# Patient Record
Sex: Female | Born: 1940 | Race: White | Hispanic: No | Marital: Married | Smoking: Never smoker
Health system: Southern US, Community
[De-identification: ages and names within clinical notes are randomized; demographics above are authoritative.]

## PROBLEM LIST (undated history)

## (undated) DIAGNOSIS — R7303 Prediabetes: Secondary | ICD-10-CM

## (undated) DIAGNOSIS — I10 Essential (primary) hypertension: Secondary | ICD-10-CM

## (undated) DIAGNOSIS — K219 Gastro-esophageal reflux disease without esophagitis: Secondary | ICD-10-CM

## (undated) DIAGNOSIS — M1990 Unspecified osteoarthritis, unspecified site: Secondary | ICD-10-CM

## (undated) HISTORY — PX: EYE SURGERY: SHX253

---

## 2008-09-20 ENCOUNTER — Ambulatory Visit (HOSPITAL_COMMUNITY): Admission: RE | Admit: 2008-09-20 | Discharge: 2008-09-20 | Payer: Self-pay | Admitting: Family Medicine

## 2008-09-25 ENCOUNTER — Ambulatory Visit (HOSPITAL_COMMUNITY): Admission: RE | Admit: 2008-09-25 | Discharge: 2008-09-25 | Payer: Self-pay | Admitting: Gastroenterology

## 2008-09-25 ENCOUNTER — Ambulatory Visit: Payer: Self-pay | Admitting: Gastroenterology

## 2008-09-25 ENCOUNTER — Encounter: Payer: Self-pay | Admitting: Gastroenterology

## 2008-09-27 ENCOUNTER — Encounter: Payer: Self-pay | Admitting: Gastroenterology

## 2010-01-24 ENCOUNTER — Ambulatory Visit (HOSPITAL_COMMUNITY): Admission: RE | Admit: 2010-01-24 | Discharge: 2010-01-24 | Payer: Self-pay | Admitting: Family Medicine

## 2010-12-10 NOTE — Op Note (Signed)
Sara Hendrix, Sara Hendrix               ACCOUNT NO.:  [id number]   MEDICAL RECORD NO.:  [id number]          PATIENT TYPE:  AMB   LOCATION:  DAY                           FACILITY:  [facility]   PHYSICIAN:  Kassie Mends, M.D.      DATE OF BIRTH:  1941-06-18   DATE OF PROCEDURE:  DATE OF DISCHARGE:                               OPERATIVE REPORT   REFERRING PHYSICIAN:  Angus G. McInnis, MD   PROCEDURE:  Colonoscopy with cold forceps polypectomy.   INDICATION FOR EXAMINATION:  Sara Hendrix is a 70 year old female who  presents for average-risk colon cancer screening.   FINDINGS:  1. A single hepatic flexure diverticulum.  Rare sigmoid colon      diverticula.  Otherwise, no masses, inflammatory changes, or AVMs      seen.  2. A 1-mm rectal polyp removed via cold forceps.  Small internal      hemorrhoids.  Otherwise, normal retroflex view of the rectum.   RECOMMENDATIONS:  1. We will call Ms. Line with the results of her biopsies.  If she has      a simple adenoma, screening colonoscopy in 10 years.  2. She should follow a high-fiber diet.  She is given a handout on      high-fiber diet, polyps, diverticulosis, and hemorrhoids.  3. No aspirin, NSAIDs, or anticoagulation for 5 days.   MEDICATIONS:  1. Demerol 75 mg IV.  2. Versed 4 mg IV.   PROCEDURE TECHNIQUE:  Physical exam was performed.  Informed consent was  obtained from the patient after explaining the benefits, risks, and  alternatives to the procedure.  The patient was connected to the monitor  and placed in the left lateral position.  Continuous oxygen was provided  by nasal cannula.  IV medicine administered through an indwelling  cannula.  After administration of sedation and rectal exam, the  patient's rectum was intubated and the scope was advanced under direct  visualization to the cecum.  The scope was removed slowly by carefully  examining the color, texture, anatomy, and integrity of the mucosa on  the way out.  The patient was  recovered in endoscopy and discharged home  in satisfactory condition.   PATH:  Polypoid mucosa. TCS in 10 years.      Kassie Mends, M.D.  Electronically Signed     SM/MEDQ  D:  09/25/2008  T:  09/25/2008  Job:  [id number]   cc:   Angus G. Renard Matter, MD  Fax: [phone]

## 2012-07-26 ENCOUNTER — Other Ambulatory Visit (HOSPITAL_COMMUNITY): Payer: Self-pay | Admitting: Nurse Practitioner

## 2012-07-26 DIAGNOSIS — Z139 Encounter for screening, unspecified: Secondary | ICD-10-CM

## 2012-08-09 ENCOUNTER — Ambulatory Visit (HOSPITAL_COMMUNITY)
Admission: RE | Admit: 2012-08-09 | Discharge: 2012-08-09 | Disposition: A | Discharge status: Home / Self Care | Payer: Medicare Other | Source: Ambulatory Visit | Attending: Nurse Practitioner | Admitting: Nurse Practitioner

## 2012-08-09 DIAGNOSIS — Z139 Encounter for screening, unspecified: Secondary | ICD-10-CM

## 2012-08-09 DIAGNOSIS — Z1231 Encounter for screening mammogram for malignant neoplasm of breast: Secondary | ICD-10-CM | POA: Insufficient documentation

## 2014-03-21 ENCOUNTER — Other Ambulatory Visit (HOSPITAL_COMMUNITY): Payer: Self-pay | Admitting: Family Medicine

## 2014-03-21 DIAGNOSIS — Z139 Encounter for screening, unspecified: Secondary | ICD-10-CM

## 2014-03-27 ENCOUNTER — Ambulatory Visit (HOSPITAL_COMMUNITY)
Admission: RE | Admit: 2014-03-27 | Discharge: 2014-03-27 | Disposition: A | Discharge status: Home / Self Care | Payer: Medicare Other | Source: Ambulatory Visit | Attending: Family Medicine | Admitting: Family Medicine

## 2014-03-27 DIAGNOSIS — Z139 Encounter for screening, unspecified: Secondary | ICD-10-CM

## 2014-03-27 DIAGNOSIS — Z1231 Encounter for screening mammogram for malignant neoplasm of breast: Secondary | ICD-10-CM | POA: Insufficient documentation

## 2014-04-29 ENCOUNTER — Emergency Department: Payer: Self-pay | Admitting: Emergency Medicine

## 2015-04-27 ENCOUNTER — Other Ambulatory Visit (HOSPITAL_COMMUNITY): Payer: Self-pay | Admitting: Nurse Practitioner

## 2015-04-27 DIAGNOSIS — Z1231 Encounter for screening mammogram for malignant neoplasm of breast: Secondary | ICD-10-CM

## 2015-05-11 ENCOUNTER — Ambulatory Visit (HOSPITAL_COMMUNITY)
Admission: RE | Admit: 2015-05-11 | Discharge: 2015-05-11 | Disposition: A | Discharge status: Home / Self Care | Payer: Medicare HMO | Source: Ambulatory Visit | Attending: Nurse Practitioner | Admitting: Nurse Practitioner

## 2015-05-11 DIAGNOSIS — Z1231 Encounter for screening mammogram for malignant neoplasm of breast: Secondary | ICD-10-CM

## 2018-10-05 ENCOUNTER — Encounter: Payer: Self-pay | Admitting: Gastroenterology

## 2019-07-23 ENCOUNTER — Emergency Department
Admission: EM | Admit: 2019-07-23 | Discharge: 2019-07-23 | Disposition: A | Discharge status: Home / Self Care | Payer: Medicare HMO | Attending: Emergency Medicine | Admitting: Emergency Medicine

## 2019-07-23 ENCOUNTER — Emergency Department: Payer: Medicare HMO

## 2019-07-23 ENCOUNTER — Encounter: Payer: Self-pay | Admitting: Emergency Medicine

## 2019-07-23 ENCOUNTER — Other Ambulatory Visit: Payer: Self-pay

## 2019-07-23 DIAGNOSIS — S82841A Displaced bimalleolar fracture of right lower leg, initial encounter for closed fracture: Secondary | ICD-10-CM | POA: Insufficient documentation

## 2019-07-23 DIAGNOSIS — Z79899 Other long term (current) drug therapy: Secondary | ICD-10-CM | POA: Diagnosis not present

## 2019-07-23 DIAGNOSIS — Y999 Unspecified external cause status: Secondary | ICD-10-CM | POA: Insufficient documentation

## 2019-07-23 DIAGNOSIS — S99911A Unspecified injury of right ankle, initial encounter: Secondary | ICD-10-CM | POA: Diagnosis present

## 2019-07-23 DIAGNOSIS — W1843XA Slipping, tripping and stumbling without falling due to stepping from one level to another, initial encounter: Secondary | ICD-10-CM | POA: Diagnosis not present

## 2019-07-23 DIAGNOSIS — Y939 Activity, unspecified: Secondary | ICD-10-CM | POA: Diagnosis not present

## 2019-07-23 DIAGNOSIS — Y92018 Other place in single-family (private) house as the place of occurrence of the external cause: Secondary | ICD-10-CM | POA: Diagnosis not present

## 2019-07-23 MED ORDER — ONDANSETRON HCL 4 MG/2ML IJ SOLN
4.0000 mg | Freq: Once | INTRAMUSCULAR | Status: AC
  Administered 2019-07-23: 21:00:00 4 mg via INTRAVENOUS
  Filled 2019-07-23: qty 2

## 2019-07-23 MED ORDER — ONDANSETRON 4 MG PO TBDP: 4.0000 mg | ORAL_TABLET | Freq: Once | ORAL | Status: DC

## 2019-07-23 MED ORDER — FENTANYL CITRATE (PF) 100 MCG/2ML IJ SOLN
25.0000 ug | Freq: Once | INTRAMUSCULAR | Status: AC
  Administered 2019-07-23: 25 ug via INTRAVENOUS
  Filled 2019-07-23: qty 2

## 2019-07-23 MED ORDER — ONDANSETRON 4 MG PO TBDP: 4.0000 mg | ORAL_TABLET | Freq: Three times a day (TID) | ORAL | 0 refills | Status: DC | PRN

## 2019-07-23 MED ORDER — HYDROCODONE-ACETAMINOPHEN 5-325 MG PO TABS: 1.0000 | ORAL_TABLET | Freq: Three times a day (TID) | ORAL | 0 refills | Status: AC | PRN

## 2019-07-23 MED ORDER — OXYCODONE-ACETAMINOPHEN 5-325 MG PO TABS
1.0000 | ORAL_TABLET | Freq: Once | ORAL | Status: AC
  Administered 2019-07-23: 1 via ORAL
  Filled 2019-07-23: qty 1

## 2019-07-23 NOTE — Discharge Instructions (Addendum)
Your exam and XR revealed a dislocated fracture of ankle (bimalleolar). We attempted to reduce the dislocation, but could not achieve full anatomic positioning, comfortably. You will call Dr. Marry Guan on Monday to arrange for a follow-up exam. Use the walker to get around without putting any weight on your splint/ankle. Rest with the foot elevated.

## 2019-07-23 NOTE — ED Provider Notes (Signed)
[facility] Emergency Department Provider Note ____________________________________________  Time seen: 2025  I have reviewed the triage vital signs and the nursing notes.  HISTORY  Chief Complaint  Ankle Pain  HPI Sara Hendrix is a 78 y.o. female presents to the ED for evaluation of sudden right ankle pain and disability after mechanical fall.  Patient describes she apparently had just missed the last step going down to her basement, when her foot slipped off lip of the step.  Causing her to roll her right ankle.  She denies any other injury at this time.  She presents now for ankle pain and deformity  History reviewed. No pertinent past medical history.  There are no problems to display for this patient.  History reviewed. No pertinent surgical history.  Prior to Admission medications   Medication Sig Start Date End Date Taking? Authorizing Provider  atorvastatin (LIPITOR) 20 MG tablet Take 20 mg by mouth daily.   Yes [provider]  lisinopril (ZESTRIL) 10 MG tablet Take 10 mg by mouth daily.   Yes [provider]  meloxicam (MOBIC) 7.5 MG tablet Take 7.5 mg by mouth daily.   Yes [provider]  omeprazole (PRILOSEC) 20 MG capsule Take 20 mg by mouth daily.   Yes [provider]  HYDROcodone-acetaminophen (NORCO) 5-325 MG tablet Take 1 tablet by mouth 3 (three) times daily as needed for up to 5 days. 07/23/19 07/28/19  Egbert Seidel, Dannielle Karvonen, PA-C  ondansetron (ZOFRAN ODT) 4 MG disintegrating tablet Take 1 tablet (4 mg total) by mouth every 8 (eight) hours as needed. 07/23/19   Tyleek Smick, Dannielle Karvonen, PA-C    Allergies Codeine  History reviewed. No pertinent family history.  Social History Social History   Tobacco Use  . Smoking status: Never Smoker  . Smokeless tobacco: Never Used  Substance Use Topics  . Alcohol use: Not on file  . Drug use: Not on file    Review of Systems  Constitutional: Negative  for fever. Cardiovascular: Negative for chest pain. Respiratory: Negative for shortness of breath. Gastrointestinal: Negative for abdominal pain, vomiting and diarrhea. Genitourinary: Negative for dysuria. Musculoskeletal: Negative for back pain. Right ankle pain and deformity. Skin: Negative for rash. Neurological: Negative for headaches, focal weakness or numbness. ____________________________________________  PHYSICAL EXAM:  VITAL SIGNS: ED Triage Vitals [07/23/19 1929]  Enc Vitals Group     BP (!) 169/74     Pulse Rate 93     Resp 18     Temp 98.4 F (36.9 C)     Temp Source Oral     SpO2 96 %     Weight 172 lb (78 kg)     Height 5\' 6"  (1.676 m)     Head Circumference      Peak Flow      Pain Score 8     Pain Loc      Pain Edu?      Excl. in [place]?     Constitutional: Alert and oriented. Well appearing and in no distress. Head: Normocephalic and atraumatic. Eyes: Conjunctivae are normal. PERRL. Normal extraocular movements Cardiovascular: Normal rate, regular rhythm. Normal distal pulses and cap refill. Respiratory: Normal respiratory effort. No wheezes/rales/rhonchi. Gastrointestinal: Soft and nontender. No distention. Musculoskeletal: right ankle with obvious deformity. Nontender with normal range of motion in all extremities.  Neurologic:  Normal gross sensation. Normal speech and language. No gross focal neurologic deficits are appreciated. Skin:  Skin is warm, dry and intact. No  rash noted. Psychiatric: Mood and affect are normal. Patient exhibits appropriate insight and judgment. ____________________________________________   RADIOLOGY  DG Right Ankle  IMPRESSION: Bimalleolar fracture-dislocation RIGHT ankle  DG Right Ankle (Post-Redux) x 2  IMPRESSION: 2130 Persistent displaced bimalleolar fractures and posterior ankle dislocation RIGHT ankle.  IMPRESSION: 2202 1. No significant interval change in the degree of displaced bimalleolar fracture and  lateral dislocation of the ankle mortise. 2. Decrease in the degree of dorsal dislocation of the ankle. ____________________________________________  PROCEDURES  Fentanyl 25 mcg IVP  Zofran 4 gm IVP  Reduction of dislocation  Date/Time: 07/23/2019 8:59 PM Performed by: Melvenia Needles, PA-C Authorized by: Melvenia Needles, PA-C  Consent: Verbal consent obtained. Written consent not obtained. Risks and benefits: risks, benefits and alternatives were discussed Consent given by: patient Patient understanding: patient states understanding of the procedure being performed Patient consent: the patient's understanding of the procedure matches consent given Procedure consent: procedure consent matches procedure scheduled Site marked: the operative site was marked Imaging studies: imaging studies available Patient identity confirmed: verbally with patient Local anesthesia used: no  Anesthesia: Local anesthesia used: no  Sedation: Patient sedated: no  Patient tolerance: patient tolerated the procedure well with no immediate complications Comments: Successful manual reduction of a displaced bimalleolar right ankle fracture.   Marland KitchenSplint Application  Date/Time: 07/23/2019 10:02 PM Performed by: Melvenia Needles, PA-C Authorized by: Melvenia Needles, PA-C   Consent:    Consent obtained:  Verbal   Consent given by:  Patient   Risks discussed:  Pain Pre-procedure details:    Sensation:  Normal Procedure details:    Laterality:  Right   Location:  Ankle   Ankle:  R ankle   Splint type:  Double sugar tong   Supplies:  Elastic bandage, cotton padding and Ortho-Glass Post-procedure details:    Pain:  Improved   Sensation:  Normal   Patient tolerance of procedure:  Tolerated well, no immediate complications   ____________________________________________  INITIAL IMPRESSION / ASSESSMENT AND PLAN / ED  COURSE  ----------------------------------------- 9:44 PM on 07/23/2019 -----------------------------------------  S/W Dr. Donivan Scull: He suggests splinting in place despite less than anatomic alignment. She will follow-up with Ortho on Monday.   Patient with initial fracture management of a bimalleolar fracture of the right ankle with displacement.  Patient is splinted after 2 attempts to reduce the dislocation.  There is some improvement with AP alignment but fracture fragments make it difficult to fully reduce the ankle.  Patient is to follow-up with Ortho as directed.  Prescriptions for Zofran and hydrocodone provided for pain relief.  She will follow-up as directed or return to the ED if necessary.  [place] was evaluated in Emergency Department on 07/23/2019 for the symptoms described in the history of present illness. She was evaluated in the context of the global COVID-19 pandemic, which necessitated consideration that the patient might be at risk for infection with the SARS-CoV-2 virus that causes COVID-19. Institutional protocols and algorithms that pertain to the evaluation of patients at risk for COVID-19 are in a state of rapid change based on information released by regulatory bodies including the CDC and federal and state organizations. These policies and algorithms were followed during the patient's care in the ED.  I reviewed the patient's prescription history over the last 12 months in the multi-state controlled substances database(s) that includes [place], [place], [place], [place], [place], [place], [place], [place], [place], [place], [place], [place], [place], and  [place].  Results were notable for no RX history.  ____________________________________________  FINAL CLINICAL IMPRESSION(S) / ED DIAGNOSES  Final diagnoses:  Bimalleolar ankle fracture, right, closed, initial encounter      Melvenia Needles, PA-C 07/23/19  2259    Vanessa [place], MD 07/23/19 2330

## 2019-07-23 NOTE — ED Triage Notes (Signed)
Patient states that she tripped on the bottom step. Patient with pain and swelling to right ankle.

## 2019-07-25 ENCOUNTER — Encounter: Payer: Self-pay | Admitting: Orthopedic Surgery

## 2019-07-26 DIAGNOSIS — S82841A Displaced bimalleolar fracture of right lower leg, initial encounter for closed fracture: Secondary | ICD-10-CM | POA: Insufficient documentation

## 2019-07-27 ENCOUNTER — Ambulatory Visit: Payer: Medicare HMO | Admitting: Certified Registered Nurse Anesthetist

## 2019-07-27 ENCOUNTER — Other Ambulatory Visit
Admission: RE | Admit: 2019-07-27 | Discharge: 2019-07-27 | Disposition: A | Discharge status: Home / Self Care | Payer: Medicare HMO | Source: Ambulatory Visit | Attending: Orthopedic Surgery | Admitting: Orthopedic Surgery

## 2019-07-27 ENCOUNTER — Other Ambulatory Visit: Payer: Self-pay

## 2019-07-27 ENCOUNTER — Encounter
Admission: RE | Disposition: A | Discharge status: Home / Self Care | Payer: Self-pay | Source: Home / Self Care | Attending: Orthopedic Surgery

## 2019-07-27 ENCOUNTER — Encounter: Payer: Self-pay | Admitting: Orthopedic Surgery

## 2019-07-27 ENCOUNTER — Ambulatory Visit
Admission: RE | Admit: 2019-07-27 | Discharge: 2019-07-27 | Disposition: A | Discharge status: Home / Self Care | Payer: Medicare HMO | Attending: Orthopedic Surgery | Admitting: Orthopedic Surgery

## 2019-07-27 DIAGNOSIS — E785 Hyperlipidemia, unspecified: Secondary | ICD-10-CM | POA: Diagnosis not present

## 2019-07-27 DIAGNOSIS — W108XXA Fall (on) (from) other stairs and steps, initial encounter: Secondary | ICD-10-CM | POA: Insufficient documentation

## 2019-07-27 DIAGNOSIS — S82841A Displaced bimalleolar fracture of right lower leg, initial encounter for closed fracture: Secondary | ICD-10-CM | POA: Diagnosis not present

## 2019-07-27 DIAGNOSIS — Z20828 Contact with and (suspected) exposure to other viral communicable diseases: Secondary | ICD-10-CM | POA: Diagnosis not present

## 2019-07-27 DIAGNOSIS — X501XXA Overexertion from prolonged static or awkward postures, initial encounter: Secondary | ICD-10-CM | POA: Insufficient documentation

## 2019-07-27 DIAGNOSIS — Y9389 Activity, other specified: Secondary | ICD-10-CM | POA: Insufficient documentation

## 2019-07-27 DIAGNOSIS — K219 Gastro-esophageal reflux disease without esophagitis: Secondary | ICD-10-CM | POA: Insufficient documentation

## 2019-07-27 DIAGNOSIS — Y92008 Other place in unspecified non-institutional (private) residence as the place of occurrence of the external cause: Secondary | ICD-10-CM | POA: Diagnosis not present

## 2019-07-27 DIAGNOSIS — Z791 Long term (current) use of non-steroidal anti-inflammatories (NSAID): Secondary | ICD-10-CM | POA: Insufficient documentation

## 2019-07-27 DIAGNOSIS — Z79899 Other long term (current) drug therapy: Secondary | ICD-10-CM | POA: Diagnosis not present

## 2019-07-27 DIAGNOSIS — I10 Essential (primary) hypertension: Secondary | ICD-10-CM | POA: Diagnosis not present

## 2019-07-27 HISTORY — PX: ORIF ANKLE FRACTURE: SHX5408

## 2019-07-27 LAB — RESPIRATORY PANEL BY RT PCR (FLU A&B, COVID)
Influenza A by PCR: NEGATIVE
Influenza B by PCR: NEGATIVE
SARS Coronavirus 2 by RT PCR: NEGATIVE

## 2019-07-27 SURGERY — OPEN REDUCTION INTERNAL FIXATION (ORIF) ANKLE FRACTURE
Anesthesia: General | Site: Ankle | Laterality: Right

## 2019-07-27 MED ORDER — HYDROCODONE-ACETAMINOPHEN 5-325 MG PO TABS: 1.0000 | ORAL_TABLET | ORAL | 0 refills | Status: DC | PRN

## 2019-07-27 MED ORDER — DEXAMETHASONE SODIUM PHOSPHATE 10 MG/ML IJ SOLN
INTRAMUSCULAR | Status: DC | PRN
  Administered 2019-07-27: 10 mg via INTRAVENOUS

## 2019-07-27 MED ORDER — PROPOFOL 10 MG/ML IV BOLUS
INTRAVENOUS | Status: DC | PRN
  Administered 2019-07-27: 100 mg via INTRAVENOUS

## 2019-07-27 MED ORDER — DEXAMETHASONE SODIUM PHOSPHATE 10 MG/ML IJ SOLN
INTRAMUSCULAR | Status: AC
  Filled 2019-07-27: qty 1

## 2019-07-27 MED ORDER — LACTATED RINGERS IV SOLN: INTRAVENOUS | Status: DC

## 2019-07-27 MED ORDER — LIDOCAINE HCL (PF) 2 % IJ SOLN
INTRAMUSCULAR | Status: AC
  Filled 2019-07-27: qty 10

## 2019-07-27 MED ORDER — ONDANSETRON HCL 4 MG/2ML IJ SOLN: 4.0000 mg | Freq: Once | INTRAMUSCULAR | Status: DC | PRN

## 2019-07-27 MED ORDER — FENTANYL CITRATE (PF) 100 MCG/2ML IJ SOLN
INTRAMUSCULAR | Status: AC
  Administered 2019-07-27: 25 ug via INTRAVENOUS
  Filled 2019-07-27: qty 2

## 2019-07-27 MED ORDER — CEFAZOLIN SODIUM-DEXTROSE 2-4 GM/100ML-% IV SOLN
2.0000 g | INTRAVENOUS | Status: AC
  Administered 2019-07-27: 2 g via INTRAVENOUS

## 2019-07-27 MED ORDER — ACETAMINOPHEN 10 MG/ML IV SOLN
INTRAVENOUS | Status: DC | PRN
  Administered 2019-07-27: 1000 mg via INTRAVENOUS

## 2019-07-27 MED ORDER — CHLORHEXIDINE GLUCONATE 4 % EX LIQD: 60.0000 mL | Freq: Once | CUTANEOUS | Status: DC

## 2019-07-27 MED ORDER — BUPIVACAINE HCL 0.25 % IJ SOLN
INTRAMUSCULAR | Status: DC | PRN
  Administered 2019-07-27: 15 mL

## 2019-07-27 MED ORDER — FENTANYL CITRATE (PF) 100 MCG/2ML IJ SOLN
INTRAMUSCULAR | Status: DC | PRN
  Administered 2019-07-27: 50 ug via INTRAVENOUS
  Administered 2019-07-27: 25 ug via INTRAVENOUS
  Administered 2019-07-27 (×2): 50 ug via INTRAVENOUS
  Administered 2019-07-27 (×3): 25 ug via INTRAVENOUS
  Administered 2019-07-27: 50 ug via INTRAVENOUS

## 2019-07-27 MED ORDER — CEFAZOLIN SODIUM-DEXTROSE 2-4 GM/100ML-% IV SOLN
INTRAVENOUS | Status: AC
  Filled 2019-07-27: qty 100

## 2019-07-27 MED ORDER — DEXMEDETOMIDINE HCL 200 MCG/2ML IV SOLN
INTRAVENOUS | Status: DC | PRN
  Administered 2019-07-27 (×2): 8 ug via INTRAVENOUS

## 2019-07-27 MED ORDER — PROPOFOL 10 MG/ML IV BOLUS
INTRAVENOUS | Status: AC
  Filled 2019-07-27: qty 20

## 2019-07-27 MED ORDER — NEOMYCIN-POLYMYXIN B GU 40-200000 IR SOLN
Status: DC | PRN
  Administered 2019-07-27: 4 mL

## 2019-07-27 MED ORDER — SUGAMMADEX SODIUM 200 MG/2ML IV SOLN
INTRAVENOUS | Status: DC | PRN
  Administered 2019-07-27: 200 mg via INTRAVENOUS

## 2019-07-27 MED ORDER — FENTANYL CITRATE (PF) 100 MCG/2ML IJ SOLN
25.0000 ug | INTRAMUSCULAR | Status: DC | PRN
  Administered 2019-07-27: 25 ug via INTRAVENOUS

## 2019-07-27 MED ORDER — PHENYLEPHRINE HCL (PRESSORS) 10 MG/ML IV SOLN
INTRAVENOUS | Status: DC | PRN
  Administered 2019-07-27 (×3): 100 ug via INTRAVENOUS

## 2019-07-27 MED ORDER — ONDANSETRON HCL 4 MG/2ML IJ SOLN
INTRAMUSCULAR | Status: AC
  Filled 2019-07-27: qty 2

## 2019-07-27 MED ORDER — MIDAZOLAM HCL 2 MG/2ML IJ SOLN
INTRAMUSCULAR | Status: AC
  Filled 2019-07-27: qty 2

## 2019-07-27 MED ORDER — FENTANYL CITRATE (PF) 100 MCG/2ML IJ SOLN
INTRAMUSCULAR | Status: AC
  Filled 2019-07-27: qty 2

## 2019-07-27 MED ORDER — ONDANSETRON HCL 4 MG/2ML IJ SOLN
INTRAMUSCULAR | Status: DC | PRN
  Administered 2019-07-27: 4 mg via INTRAVENOUS

## 2019-07-27 MED ORDER — ROCURONIUM BROMIDE 100 MG/10ML IV SOLN
INTRAVENOUS | Status: DC | PRN
  Administered 2019-07-27: 10 mg via INTRAVENOUS
  Administered 2019-07-27: 50 mg via INTRAVENOUS

## 2019-07-27 MED ORDER — LIDOCAINE HCL (CARDIAC) PF 100 MG/5ML IV SOSY
PREFILLED_SYRINGE | INTRAVENOUS | Status: DC | PRN
  Administered 2019-07-27: 80 mg via INTRAVENOUS

## 2019-07-27 MED ORDER — ROCURONIUM BROMIDE 50 MG/5ML IV SOLN
INTRAVENOUS | Status: AC
  Filled 2019-07-27: qty 1

## 2019-07-27 SURGICAL SUPPLY — 52 items
BIT DRILL 2.5X2.75 QC CALB (BIT) ×3 IMPLANT
BIT DRILL 2.9 CANN QC NONSTRL (BIT) ×3 IMPLANT
BIT DRILL CALIBRATED 2.7 (BIT) ×2 IMPLANT
BIT DRILL CALIBRATED 2.7MM (BIT) ×1
BNDG COHESIVE 4X5 TAN STRL (GAUZE/BANDAGES/DRESSINGS) ×3 IMPLANT
BNDG ELASTIC 4X5.8 VLCR STR LF (GAUZE/BANDAGES/DRESSINGS) ×3 IMPLANT
BNDG ESMARK 6X12 TAN STRL LF (GAUZE/BANDAGES/DRESSINGS) ×3 IMPLANT
COOLER POLAR GLACIER W/PUMP (MISCELLANEOUS) ×3 IMPLANT
COVER LIGHT HANDLE STERIS (MISCELLANEOUS) ×3 IMPLANT
COVER WAND RF STERILE (DRAPES) ×3 IMPLANT
CUFF TOURN SGL QUICK 24 (TOURNIQUET CUFF) ×2
CUFF TOURN SGL QUICK 30 (TOURNIQUET CUFF)
CUFF TRNQT CYL 24X4X16.5-23 (TOURNIQUET CUFF) ×1 IMPLANT
CUFF TRNQT CYL 30X4X21-28X (TOURNIQUET CUFF) IMPLANT
DRAPE FLUOR MINI C-ARM 54X84 (DRAPES) ×3 IMPLANT
DRSG DERMACEA 8X12 NADH (GAUZE/BANDAGES/DRESSINGS) ×3 IMPLANT
DURAPREP 26ML APPLICATOR (WOUND CARE) ×3 IMPLANT
ELECT CAUTERY BLADE 6.4 (BLADE) ×3 IMPLANT
ELECT REM PT RETURN 9FT ADLT (ELECTROSURGICAL) ×3
ELECTRODE REM PT RTRN 9FT ADLT (ELECTROSURGICAL) ×1 IMPLANT
GAUZE SPONGE 4X4 12PLY STRL (GAUZE/BANDAGES/DRESSINGS) ×3 IMPLANT
GLOVE BIOGEL M STRL SZ7.5 (GLOVE) ×3 IMPLANT
GLOVE INDICATOR 8.0 STRL GRN (GLOVE) ×3 IMPLANT
GOWN STRL REUS W/ TWL LRG LVL3 (GOWN DISPOSABLE) ×2 IMPLANT
GOWN STRL REUS W/TWL LRG LVL3 (GOWN DISPOSABLE) ×4
K-WIRE ACE 1.6X6 (WIRE) ×3
KIT TURNOVER KIT A (KITS) ×3 IMPLANT
KWIRE ACE 1.6X6 (WIRE) ×1 IMPLANT
LABEL OR SOLS (LABEL) ×3 IMPLANT
PACK EXTREMITY ARMC (MISCELLANEOUS) ×3 IMPLANT
PAD ABD DERMACEA PRESS 5X9 (GAUZE/BANDAGES/DRESSINGS) ×9 IMPLANT
PAD CAST CTTN 4X4 STRL (SOFTGOODS) ×3 IMPLANT
PAD PREP 24X41 OB/GYN DISP (PERSONAL CARE ITEMS) ×3 IMPLANT
PAD WRAPON POLAR ANKLE (MISCELLANEOUS) ×1 IMPLANT
PADDING CAST COTTON 4X4 STRL (SOFTGOODS) ×6
PLATE LOCK 3H 95 RT DIST FIB (Plate) ×3 IMPLANT
SCREW ACE CAN 4.0 50M (Screw) ×6 IMPLANT
SCREW LOCK CORT STAR 3.5X12 (Screw) ×3 IMPLANT
SCREW LOCK CORT STAR 3.5X14 (Screw) ×3 IMPLANT
SCREW LOCK CORT STAR 3.5X16 (Screw) ×6 IMPLANT
SCREW LOCK CORT STAR 3.5X18 (Screw) ×6 IMPLANT
SCREW LOW PROFILE 18MMX3.5MM (Screw) ×3 IMPLANT
SCREW NON LOCKING LP 3.5 14MM (Screw) ×3 IMPLANT
SCREW NON LOCKING LP 3.5 16MM (Screw) ×3 IMPLANT
SPLINT CAST 1 STEP 5X30 WHT (MISCELLANEOUS) ×3 IMPLANT
SPONGE LAP 18X18 RF (DISPOSABLE) ×3 IMPLANT
STAPLER SKIN PROX 35W (STAPLE) ×6 IMPLANT
STOCKINETTE STRL 6IN 960660 (GAUZE/BANDAGES/DRESSINGS) ×3 IMPLANT
SUT VIC AB 0 CT1 36 (SUTURE) ×6 IMPLANT
SUT VIC AB 2-0 SH 27 (SUTURE) ×2
SUT VIC AB 2-0 SH 27XBRD (SUTURE) ×1 IMPLANT
WRAPON POLAR PAD ANKLE (MISCELLANEOUS) ×3

## 2019-07-27 NOTE — Anesthesia Post-op Follow-up Note (Signed)
Anesthesia QCDR form completed.        

## 2019-07-27 NOTE — H&P (Signed)
The patient has been re-examined, and the chart reviewed, and there have been no interval changes to the documented history and physical.    The risks, benefits, and alternatives have been discussed at length. The patient expressed understanding of the risks benefits and agreed with plans for surgical intervention.  Zyden Suman P. Kasyn Rolph, [name] M.D.    

## 2019-07-27 NOTE — Anesthesia Preprocedure Evaluation (Signed)
Anesthesia Evaluation  Patient identified by MRN, date of birth, ID band Patient awake    Reviewed: Allergy & Precautions, H&P , NPO status , Patient's Chart, lab work & pertinent test results, reviewed documented beta blocker date and time   History of Anesthesia Complications Negative for: history of anesthetic complications  Airway Mallampati: II  TM Distance: >3 FB Neck ROM: full    Dental  (+) Edentulous Upper, Edentulous Lower, Dental Advidsory Given   Pulmonary neg pulmonary ROS,    Pulmonary exam normal        Cardiovascular hypertension, (-) angina(-) Past MI and (-) Cardiac Stents Normal cardiovascular exam(-) dysrhythmias (-) Valvular Problems/Murmurs     Neuro/Psych negative neurological ROS  negative psych ROS   GI/Hepatic Neg liver ROS, GERD  ,  Endo/Other  negative endocrine ROS  Renal/GU negative Renal ROS  negative genitourinary   Musculoskeletal   Abdominal   Peds  Hematology negative hematology ROS (+)   Anesthesia Other Findings History reviewed. No pertinent past medical history.   Reproductive/Obstetrics negative OB ROS                             Anesthesia Physical  Anesthesia Plan  ASA: II  Anesthesia Plan: General   Post-op Pain Management:    Induction: Intravenous  PONV Risk Score and Plan: 3 and Ondansetron, Dexamethasone and Treatment may vary due to age or medical condition  Airway Management Planned: Oral ETT  Additional Equipment:   Intra-op Plan:   Post-operative Plan: Extubation in OR  Informed Consent: I have reviewed the patients History and Physical, chart, labs and discussed the procedure including the risks, benefits and alternatives for the proposed anesthesia with the patient or authorized representative who has indicated his/her understanding and acceptance.     Dental Advisory Given  Plan Discussed with: Anesthesiologist, CRNA  and Surgeon  Anesthesia Plan Comments:         Anesthesia Quick Evaluation  

## 2019-07-27 NOTE — Op Note (Signed)
OPERATIVE NOTE  DATE OF SURGERY:  07/27/2019  PATIENT NAME:  Sara Hendrix   DOB: Dec 19, 1940  MRN: [id number]  PRE-OPERATIVE DIAGNOSIS: Right bimalleolar ankle fracture  POST-OPERATIVE DIAGNOSIS:  Same  PROCEDURE:  Open reduction and internal fixation of the right bimalleolar ankle fracture  SURGEON:  Marciano Sequin. M.D.  ANESTHESIA: general  ESTIMATED BLOOD LOSS: Minimal  FLUIDS REPLACED: 700 mL of crystalloid  TOURNIQUET TIME: 118 minutes  DRAINS: None  IMPLANTS UTILIZED: Biomet 3-hole anatomic fibular locking plate, 3 - 3.5 mm cortical screws, 4 - 3.5 mm locking screws, 2 - 4.0 mm cannulated partially threaded cancellous screws  INDICATIONS FOR SURGERY: [name] is a 78 y.o. year old female who sustained a right bimalleolar ankle fracture/dislocation. After discussion of the risks and benefits of surgical intervention, the patient expressed understanding of the risks benefits and agree with plans for open reduction and internal fixation.   PROCEDURE IN DETAIL: The patient was brought into the operating room and after adequate general anesthesia, a tourniquet was placed on the patient's right thigh.The right foot and ankle were prepped with alcohol and Duraprep and draped in the usual sterile fashion. A "time-out" was performed as per usual protocol. The foot and ankle were exsanguinated using an Esmarch and the tourniquet was inflated to 300 mmHg. A lateral longitudinal incision was made in line with the distal fibula. Dissection was carried down to the fracture site and the site was debrided of hematoma and soft tissue.  There was marked comminution to the distal fragment.  A provisional reduction of the distal fibular fracture was performed and position visualized using the FluoroScan. A 3 hole Biomet anatomic fibular locking plate was contoured to fit along the lateral aspect of the distal fibula. The plate was then secured using a combination of three 3.5 mm cortical  screws and four 3.5 mm locking screws. Good reduction and hardware placement was noted using the FluoroScan. The lateral incision was then closed in layers using first #0 Vicryl followed by #2-0 Vicryl.  Next, attention was directed to the medial malleolus. A medial incision was made and dissection was carried down to the distal aspect of the medial malleolus. Provisional reduction was performed and visualized using the FluoroScan. The reduction was maintained using 2  guidewires passed in a retrograde fashion and in parallel position. Good position was noted. Measurements were obtained and two 4.0 mm cannulated partially threaded cancellous screws were advanced over the guidewires. Good position was noted. Guidewires were removed and the wound was irrigated with copious amounts of saline with antibiotic solution and then closed using combination of #0 Vicryl followed #2-0 Vicryl. Good reduction with restoration of the ankle mortise and good position of hardware was noted in multiple planes using FluoroScan. Skin edges were then reapproximated using skin staples. Tourniquet was deflated after total tourniquet time of 118 minutes. A sterile dressing was applied followed by application of a posterior splint.   The patient tolerated the procedure well and was transported to the PACU in stable condition.  Sumiya Mamaril P. Holley Bouche., M.D.

## 2019-07-27 NOTE — Discharge Instructions (Signed)
AMBULATORY SURGERY  DISCHARGE INSTRUCTIONS   1) The drugs that you were given will stay in your system until tomorrow so for the next 24 hours you should not:  A) Drive an automobile B) Make any legal decisions C) Drink any alcoholic beverage   2) You may resume regular meals tomorrow.  Today it is better to start with liquids and gradually work up to solid foods.  You may eat anything you prefer, but it is better to start with liquids, then soup and crackers, and gradually work up to solid foods.   3) Please notify your doctor immediately if you have any unusual bleeding, trouble breathing, redness and pain at the surgery site, drainage, fever, or pain not relieved by medication.    4) Additional Instructions:        Please contact your physician with any problems or Same Day Surgery at [phone], Monday through Friday 6 am to 4 pm, or [place] [facility] number at [phone]. Instructions for Ankle Fracture   James P. Holley Bouche., M.D.     Dept. of [facility]  [place] [place], [place]  [address]   Phone: [phone]   Fax: [phone]   ACTIVITY:  . You may use crutches or a walker with NO weight-bearing to the affected leg  WOUND CARE:  . Place one to two pillows under the left foot and ankle when sitting or lying.  . Continue to use the ice packs or the Polar Care to reduce pain and swelling. Marland Kitchen Keep the left splint clean and dry.  MEDICATIONS: . Dennis Bast may resume your regular medications. . Please take the pain medication as prescribed. . Do not take pain medication on an empty stomach. . Do not drive or drink alcoholic beverages when taking pain medications.  CALL THE OFFICE FOR: . Temperature above 101 degrees . Excessive bleeding or drainage on the dressing. . Excessive swelling, coldness, or paleness of the toes. . Persistent nausea and vomiting.  FOLLOW-UP:  . You should have an  appointment to return to the office in 7-10 days.

## 2019-07-27 NOTE — Transfer of Care (Signed)
Immediate Anesthesia Transfer of Care Note  Patient: Sara Hendrix  Procedure(s) Performed: OPEN REDUCTION INTERNAL FIXATION (ORIF) ANKLE FRACTURE, TRIMALLEOLAR (Right Ankle)  Patient Location: PACU  Anesthesia Type:General  Level of Consciousness: sedated  Airway & Oxygen Therapy: Patient Spontanous Breathing and Patient connected to face mask oxygen  Post-op Assessment: Report given to RN and Post -op Vital signs reviewed and stable  Post vital signs: Reviewed and stable  Last Vitals:  Vitals Value Taken Time  BP 112/46 07/27/19 1457  Temp 37.1 C 07/27/19 1457  Pulse 77 07/27/19 1458  Resp 19 07/27/19 1458  SpO2 97 % 07/27/19 1458  Vitals shown include unvalidated device data.  Last Pain:  Vitals:   07/27/19 0947  TempSrc: Temporal  PainSc: 6          Complications: No apparent anesthesia complications

## 2019-07-27 NOTE — Anesthesia Procedure Notes (Signed)
Procedure Name: Intubation Date/Time: 07/27/2019 12:01 PM Performed by: Caryl Asp, CRNA Pre-anesthesia Checklist: Patient identified, Patient being monitored, Timeout performed, Emergency Drugs available and Suction available Patient Re-evaluated:Patient Re-evaluated prior to induction Oxygen Delivery Method: Circle system utilized Preoxygenation: Pre-oxygenation with 100% oxygen Induction Type: IV induction Ventilation: Mask ventilation without difficulty and Oral airway inserted - appropriate to patient size Laryngoscope Size: Mac and 3 Grade View: Grade I Tube type: Oral Tube size: 7.0 mm Number of attempts: 1 Airway Equipment and Method: Stylet Placement Confirmation: ETT inserted through vocal cords under direct vision,  positive ETCO2 and breath sounds checked- equal and bilateral Secured at: 21 cm Tube secured with: Tape Dental Injury: Teeth and Oropharynx as per pre-operative assessment

## 2019-07-27 NOTE — H&P (Signed)
ORTHOPAEDIC HISTORY & PHYSICAL  Progress Notes - documented in this encounter Table of Contents for Progress Notes  Sara Hendrix, [name], MD - 07/26/2019 4:00 PM EST  Kerin Perna, RN - 07/26/2019 4:00 PM EST    Drayden Lukas, [name], MD - 07/26/2019 4:00 PM EST Formatting of this note might be different from the original. Chief Complaint: Chief Complaint  Patient presents with  . Ankle Pain  Right trimalleolar fracture 07/23/19   Reason for Visit: The patient is a 78 y.o. female who presents today with her daughter for reevaluation of her right ankle. On 07/23/2019 the patient was going down her steps into the basement when she got to the last step she fell twisting the right ankle. She had the immediate onset of right ankle pain and was unable to stand or bear weight on the right lower extremity. She was evaluated in the emergency department and underwent 2 attempts at closed reduction. She was subsequently placed in a splint and referred to this office for definitive treatment.  Medications: Current Outpatient Medications  Medication Sig Dispense Refill  . atorvastatin (LIPITOR) 20 MG tablet Take 20 mg by mouth once daily  . HYDROcodone-acetaminophen (NORCO) 5-325 mg tablet Take 1 tablet by mouth every 8 (eight) hours as needed  . lisinopriL (ZESTRIL) 10 MG tablet Take 10 mg by mouth once daily  . meloxicam (MOBIC) 15 MG tablet Take 15 mg by mouth once daily  . omeprazole (PRILOSEC) 20 MG DR capsule Take 20 mg by mouth once daily  . ondansetron (ZOFRAN-ODT) 4 MG disintegrating tablet Take 4 mg by mouth every 8 (eight) hours as needed   No current facility-administered medications for this visit.   Allergies: Allergies  Allergen Reactions  . Codeine Hallucination   Past Medical History: Past Medical History:  Diagnosis Date  . GERD (gastroesophageal reflux disease)  . Hyperlipidemia  . Hypertension   Past Surgical History: History reviewed. No pertinent  surgical history.  Social History: Social History   Socioeconomic History  . Marital status: Married  Spouse name: [name]  . Number of children: 2  . Years of education: 39  . Highest education level: High school graduate  Occupational History  . 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 Financial resource strain: Not on file  . Food insecurity  Worry: Not on file  Inability: Not on file  . Transportation needs  Medical: Not on file  Non-medical: Not on file  Tobacco Use  . Smoking status: Never Smoker  . Smokeless tobacco: Never Used  Substance and Sexual Activity  . Alcohol use: Never  Frequency: Never  . Drug use: Never  . Sexual activity: Defer  Partners: Male  Lifestyle  . Physical activity  Days per week: Not on file  Minutes per session: Not on file  . Stress: Not on file  Relationships  . Social Medical illustrator on phone: Not on file  Gets together: Not on file  Attends religious service: Not on file  Active member of club or organization: Not on file  Attends meetings of clubs or organizations: Not on file  Relationship status: Not on file  Other Topics Concern  . Not on file  Social History Narrative  . Not on file   Family History: Family History  Problem Relation Age of Onset  . Stroke Mother  . Diabetes type II Sister   Review of Systems: A comprehensive 14 point ROS was performed, reviewed, and the pertinent orthopaedic findings  are documented in the HPI.  Exam BP 142/74  Temp 36.6 C (97.9 F) (Skin)  Ht 167.6 cm (5\' 6" )  Wt 78 kg (172 lb)  BMI 27.76 kg/m   General:  Well-developed, well-nourished female seen in no acute distress.  Gait was not assessed since the patient presented in a wheelchair.   HEENT:  Atraumatic, normocephalic. Pupils are equal and reactive to light. Extraocular motion is intact. Sclera are clear. Oropharynx is clear with moist mucosa.  Neck:  Supple, nontender, and with good ROM. No thyromegaly,  adenopathy, JVD, or carotid bruits.  Lungs:  Clear to auscultation bilaterally.  Cardiovascular:  Regular rate and rhythm. Normal S1, S2. No murmur. No appreciable gallops or rubs. Peripheral pulses are palpable. Homan`s test is negative.  Abdomen:  Soft, nontender, nondistended. Bowel sounds are present.  Extremities: Good strength, stability, and range of motion of the upper extremities. Good range of motion of the hips and knees.  Right Ankle: Soft tissue swelling: moderate Erythema: none Tenderness: Tender to the medial and lateral malleoli Alignment: Obvious deformity at the ankle  Range of motion: Not assessed due to the injury  Fracture blisters are noted to the medial aspect of the ankle  Neurologic:  Awake, alert, and oriented.  Sensory function is intact to pinprick and light touch.  Motor strength is judged to be 5/5.  Motor coordination is within normal limits.  No apparent clonus. No tremor.   X-rays: I reviewed the right ankle radiographs that were performed at [facility] on 07/23/2019.  LEFT ANKLE COMPLETE - 3+ VIEW  COMPARISON: None  FINDINGS: Osseous demineralization.  Transverse fracture medial malleolus, displaced laterally.  Oblique fracture of lateral malleolus, displaced laterally.  Posterior dislocation of the talus.  No additional fractures identified.  Intertarsal joints appear normally aligned.  IMPRESSION: Bimalleolar fracture-dislocation RIGHT ankle  Electronically Signed By: Lavonia Dana M.D. On: 07/23/2019 19:54   Impression: Right bimalleolar ankle fracture  Plan:  The findings were discussed in detail with the patient and her daughter. I recommended open reduction with internal fixation of the right ankle fracture. We discussed the risks and benefits of surgical intervention. The usual perioperative course was also discussed in detail. The patient expressed understanding of the risks and benefits of  surgical intervention and would like to proceed with plans for open reduction with internal fixation of the right bimalleolar ankle fracture.  A new well-padded stirrup splint was applied to the right lower leg.  MEDICAL CLEARANCE: Per anesthesiology. ACTIVITY: As tolerated. WORK STATUS: Anticipate out of work for 6-8 weeks following surgery. THERAPY: Preoperative physical therapy evaluation. MEDICATIONS: Requested Prescriptions   No prescriptions requested or ordered in this encounter   FOLLOW-UP: Return for postoperative follow-up.  Aylana Hirschfeld P. Holley Bouche., M.D.  This note was generated in part with voice recognition software and I apologize for any typographical errors that were not detected and corrected.   Electronically signed by Lamar Benes., MD at 07/26/2019 11:24 PM EST

## 2019-07-28 NOTE — Anesthesia Postprocedure Evaluation (Signed)
Anesthesia Post Note  Patient: Sara Hendrix  Procedure(s) Performed: OPEN REDUCTION INTERNAL FIXATION (ORIF) ANKLE FRACTURE, TRIMALLEOLAR (Right Ankle)  Patient location during evaluation: PACU Anesthesia Type: General Level of consciousness: awake and alert Pain management: pain level controlled Vital Signs Assessment: post-procedure vital signs reviewed and stable Respiratory status: spontaneous breathing, nonlabored ventilation, respiratory function stable and patient connected to nasal cannula oxygen Cardiovascular status: blood pressure returned to baseline and stable Postop Assessment: no apparent nausea or vomiting Anesthetic complications: no     Last Vitals:  Vitals:   07/27/19 1606 07/27/19 1703  BP: (!) 153/67 (!) 144/58  Pulse: 78 82  Resp: 18 18  Temp: 36.8 C   SpO2: 93% 97%    Last Pain:  Vitals:   07/27/19 1703  TempSrc:   PainSc: 0-No pain                 Martha Clan

## 2020-06-18 ENCOUNTER — Other Ambulatory Visit: Admission: RE | Admit: 2020-06-18 | Payer: Medicare HMO | Source: Ambulatory Visit

## 2020-06-18 HISTORY — DX: Gastro-esophageal reflux disease without esophagitis: K21.9

## 2020-06-18 HISTORY — DX: Essential (primary) hypertension: I10

## 2020-06-19 ENCOUNTER — Other Ambulatory Visit: Payer: Self-pay

## 2020-06-19 ENCOUNTER — Other Ambulatory Visit
Admission: RE | Admit: 2020-06-19 | Discharge: 2020-06-19 | Disposition: A | Discharge status: Home / Self Care | Payer: Medicare Other | Source: Ambulatory Visit | Attending: Orthopedic Surgery | Admitting: Orthopedic Surgery

## 2020-06-19 NOTE — Patient Instructions (Signed)
Your procedure is scheduled on: Monday June 25, 2020. Report to Day Surgery inside [facility] 2nd floor (stop by Registration desk first). To find out your arrival time please call [phone] between 1PM - 3PM on Friday April 22, 2020.  Remember: Instructions that are not followed completely may result in serious medical risk,  up to and including death, or upon the discretion of your surgeon and anesthesiologist your  surgery may need to be rescheduled.     _X__ 1. Do not eat food after midnight the night before your procedure.                 No chewing gum or hard candies. You may drink clear liquids up to 2 hours                 before you are scheduled to arrive for your surgery- DO not drink clear                 liquids within 2 hours of the start of your surgery.                 Clear Liquids include:  water, apple juice without pulp, clear Gatorade, G2 or                  Gatorade Zero (avoid Red/Purple/Blue), Black Coffee or Tea (Do not add                 anything to coffee or tea).  __X__2.   Complete the "Ensure Clear Pre-surgery Clear Carbohydrate Drink" provided to you, 2 hours before arrival. **If you are diabetic you will be provided with an alternative drink, Gatorade Zero or G2.  __X__3.  On the morning of surgery brush your teeth with toothpaste and water, you                may rinse your mouth with mouthwash if you wish.  Do not swallow any toothpaste of mouthwash.     _X__ 4.  No Alcohol for 24 hours before or after surgery.   _X__ 5.  Do Not Smoke or use e-cigarettes For 24 Hours Prior to Your Surgery.                 Do not use any chewable tobacco products for at least 6 hours prior to                 Surgery.  _X__  6.  Do not use any recreational drugs (marijuana, cocaine, heroin, ecstasy, MDMA or other)                For at least one week prior to your surgery.  Combination of these drugs with anesthesia                 May have life threatening results.  __X__  7.  Notify your doctor if there is any change in your medical condition      (cold, fever, infections).     Do not wear jewelry, make-up, hairpins, clips or nail polish. Do not wear lotions, powders, or perfumes. You may wear deodorant. Do not shave 48 hours prior to surgery. Men may shave face and neck. Do not bring valuables to the hospital.    [facility] is not responsible for any belongings or valuables.  Contacts, dentures or bridgework may not be worn into surgery. Leave your suitcase in the car. After surgery it may be  brought to your room. For patients admitted to the hospital, discharge time is determined by your treatment team.   Patients discharged the day of surgery will not be allowed to drive home.   Make arrangements for someone to be with you for the first 24 hours of your Same Day Discharge.   __X__ Take these medicines the morning of surgery with A SIP OF WATER:    1. omeprazole (PRILOSEC) 20 MG   ____ Fleet Enema (as directed)   __X__ Use CHG Soap (or wipes) as directed  ____ Use Benzoyl Peroxide Gel as instructed  ____ Use inhalers on the day of surgery  ____ Stop metformin 2 days prior to surgery    ____ Take 1/2 of usual insulin dose the night before surgery. No insulin the morning          of surgery.   __X__ Stop aspirin EC 81 MG as instructed by your provider.   __X__ Stop Anti-inflammatories meloxicam (MOBIC), Ibuprofen, Aleve, Advil, naproxen and or BC powder.   __X__ Stop supplements until after surgery.    __X__ Do not start any herbal supplements before your procedure.     If you have any questions regarding your pre-procedure instructions,  Please call Pre-admit Testing at [phone].

## 2020-06-20 ENCOUNTER — Other Ambulatory Visit: Payer: Medicare HMO

## 2020-06-20 ENCOUNTER — Encounter
Admission: RE | Admit: 2020-06-20 | Discharge: 2020-06-20 | Disposition: A | Discharge status: Home / Self Care | Payer: Medicare Other | Source: Ambulatory Visit | Attending: Orthopedic Surgery | Admitting: Orthopedic Surgery

## 2020-06-20 DIAGNOSIS — Z20822 Contact with and (suspected) exposure to covid-19: Secondary | ICD-10-CM | POA: Insufficient documentation

## 2020-06-20 DIAGNOSIS — Z01818 Encounter for other preprocedural examination: Secondary | ICD-10-CM | POA: Insufficient documentation

## 2020-06-20 DIAGNOSIS — Z01810 Encounter for preprocedural cardiovascular examination: Secondary | ICD-10-CM

## 2020-06-20 LAB — SARS CORONAVIRUS 2 (TAT 6-24 HRS): SARS Coronavirus 2: NEGATIVE

## 2020-06-21 ENCOUNTER — Other Ambulatory Visit: Payer: Medicare HMO

## 2020-06-22 ENCOUNTER — Other Ambulatory Visit: Payer: Medicare Other

## 2020-06-24 ENCOUNTER — Encounter: Payer: Self-pay | Admitting: Orthopedic Surgery

## 2020-06-24 NOTE — H&P (Signed)
ORTHOPAEDIC HISTORY & PHYSICAL Office Visit Sara Hendrix, [place] - 06/15/2020 9:30 AM EST Sara Hendrix [facility] Chief Complaint:   Chief Complaint  Patient presents with  . Right Ankle - Pre-op Exam   History of Present Illness:   Sara Hendrix is a 79 y.o. female that presents to clinic today for her preoperative history and evaluation. Patient presents unaccompanied. The patient is scheduled to undergo hardware removal of right ankle on 06/25/20 by Dr. Marry Guan. Patient is status post ORIF of a right bimalleolar ankle fracture on 07/27/2019 by Dr. Marry Guan. Patient began experiencing irritation and drainage over the lateral ankle incision approximately 1 month ago. Patient subsequently underwent a week of Bactrim with no drainage or lesion noted currently. Patient's pain over the site continues however. The decision was made to proceed with hardware removal of the right ankle. She denies associated numbness or tingling.   Patient denies history of lower back surgery, denies history of blood clots, denies significant cardiac history. She does not have any significant allergies other than codeine.  Past Medical, Surgical, Family, Social History, Allergies, Medications:   Past Medical History:  Past Medical History:  Diagnosis Date  . GERD (gastroesophageal reflux disease)  . Hyperlipidemia  . Hypertension   Past Surgical History:  Past Surgical History:  Procedure Laterality Date  . Open reduction and internal fixation of the right bimalleolar ankle fracture 07/27/2019  Dr Marry Guan   Current Medications:  Current Outpatient Medications  Medication Sig Dispense Refill  . acetaminophen (TYLENOL) 500 MG tablet Take 500 mg by mouth 2 (two) times daily as needed for Pain  . atorvastatin (LIPITOR) 20 MG tablet Take 20 mg by mouth once daily  . lisinopriL (ZESTRIL) 10 MG tablet Take 10 mg by mouth once daily  . meloxicam (MOBIC) 15 MG tablet Take 15 mg  by mouth once daily  . omeprazole (PRILOSEC) 20 MG DR capsule Take 20 mg by mouth once daily   No current facility-administered medications for this visit.   Allergies:  Allergies  Allergen Reactions  . Codeine Hallucination   Social History:  Social History   Socioeconomic History  . Marital status: Married  Spouse name: [name]  . Number of children: 2  . Years of education: 67  . Highest education level: High school graduate  Occupational History  . Occupation: Full-time -Charity fundraiser  Tobacco Use  . Smoking status: Never Smoker  . Smokeless tobacco: Never Used  Vaping Use  . Vaping Use: Never used  Substance and Sexual Activity  . Alcohol use: Never  . Drug use: Never  . Sexual activity: Defer  Partners: Male  Other Topics Concern  . Not on file  Social History Narrative  . Not on file   Social Determinants of Health   Financial Resource Strain: Not on file  Food Insecurity: Not on file  Transportation Needs: Not on file  Physical Activity: Not on file  Stress: Not on file  Social Connections: Not on file  Housing Stability: Not on file   Family History:  Family History  Problem Relation Age of Onset  . Stroke Mother  . Diabetes type II Sister   Review of Systems:   A 10+ ROS was performed, reviewed, and the pertinent orthopaedic findings are documented in the HPI.   Physical Examination:   BP 128/76  Ht 167.6 cm (5\' 6" )  Wt 75.3 kg (166 lb)  BMI 26.79 kg/m   Patient is a well-developed, well-nourished female in  no acute distress. Patient has normal mood and affect. Patient is alert and oriented to person, place, and time.   HEENT: Atraumatic, normocephalic. Pupils equal and reactive to light. Extraocular motion intact. Noninjected sclera.  Cardiovascular: Regular rate and rhythm, with no murmurs, rubs, or gallops. Distal pulses palpable.  Respiratory: Lungs clear to auscultation bilaterally.   Right Ankle:  Soft tissue swelling:  minimal Effusion: none Erythema: none Crepitance: none Tenderness: None  Ankle drawer test: negative Incision: Incision is well-healed without drainage, erythema, or sign of infection. Range of Motion: Good ankle dorsiflexion and plantarflexion is noted.   Sensation intact over the saphenous, lateral sural cutaneous, superficial fibular, and deep fibular nerve distributions.  Tests Performed/Reviewed:  X-rays  No new radiographs were obtained today. Previous radiographs were reviewed of the right ankle and revealed well-preserved mortise. Well-healed distal fibula fracture status post ORIF with plate and screws noted without signs of loosening, migration, or failure. Well-healed medial malleolus fracture status post ORIF with screws noted.  Impression:   ICD-10-CM  1. Bimalleolar ankle fracture, right, closed, with routine healing, subsequent encounter S82.841D  2. S/P ORIF (open reduction internal fixation) fracture Z98.890  Z87.81  3. Retained orthopedic hardware Z96.9   Plan:   Patient's condition is consistent with painful retained hardware. As patient has not improved with conservative measures, the decision was made to proceed with hardware removal.The patient will undergo hardware removal of the right ankle with Dr. Marry Guan. The risks of surgery, including blood clot and infection, were discussed with the patient. Measures to reduce these risks, including the use of anticoagulation, perioperative antibiotics, and early ambulation were discussed. The importance of postoperative physical therapy was discussed with the patient. The patient elects to proceed with surgery. The patient is instructed to stop all blood thinners prior to surgery. The patient is instructed to call the hospital the day before surgery to learn of the proper arrival time.   Contact our office with any questions or concerns. Follow up as indicated, or sooner should any new problems arise, if conditions worsen, or  if they are otherwise concerned.   Sara Fudge, PA-C [facility] [place], [place] [address] Phone: [phone]  This note was generated in part with voice recognition software and I apologize for any typographical errors that were not detected and corrected.   Electronically signed by Sara Fudge, PA at 06/15/2020 12:28 PM EST

## 2020-06-25 ENCOUNTER — Ambulatory Visit: Payer: Medicare Other | Admitting: Anesthesiology

## 2020-06-25 ENCOUNTER — Other Ambulatory Visit: Payer: Self-pay

## 2020-06-25 ENCOUNTER — Ambulatory Visit
Admission: RE | Admit: 2020-06-25 | Discharge: 2020-06-25 | Disposition: A | Discharge status: Home / Self Care | Payer: Medicare Other | Attending: Orthopedic Surgery | Admitting: Orthopedic Surgery

## 2020-06-25 ENCOUNTER — Encounter: Payer: Self-pay | Admitting: Orthopedic Surgery

## 2020-06-25 ENCOUNTER — Encounter
Admission: RE | Disposition: A | Discharge status: Home / Self Care | Payer: Self-pay | Source: Home / Self Care | Attending: Orthopedic Surgery

## 2020-06-25 ENCOUNTER — Ambulatory Visit: Payer: Medicare Other

## 2020-06-25 DIAGNOSIS — X58XXXD Exposure to other specified factors, subsequent encounter: Secondary | ICD-10-CM | POA: Insufficient documentation

## 2020-06-25 DIAGNOSIS — S82841D Displaced bimalleolar fracture of right lower leg, subsequent encounter for closed fracture with routine healing: Secondary | ICD-10-CM | POA: Diagnosis not present

## 2020-06-25 DIAGNOSIS — S82841A Displaced bimalleolar fracture of right lower leg, initial encounter for closed fracture: Secondary | ICD-10-CM

## 2020-06-25 DIAGNOSIS — Z79899 Other long term (current) drug therapy: Secondary | ICD-10-CM | POA: Diagnosis not present

## 2020-06-25 DIAGNOSIS — Z885 Allergy status to narcotic agent status: Secondary | ICD-10-CM | POA: Insufficient documentation

## 2020-06-25 DIAGNOSIS — Z9889 Other specified postprocedural states: Secondary | ICD-10-CM

## 2020-06-25 DIAGNOSIS — T8484XD Pain due to internal orthopedic prosthetic devices, implants and grafts, subsequent encounter: Secondary | ICD-10-CM | POA: Diagnosis present

## 2020-06-25 HISTORY — PX: HARDWARE REMOVAL: SHX979

## 2020-06-25 SURGERY — REMOVAL, HARDWARE
Anesthesia: General | Site: Ankle | Laterality: Right

## 2020-06-25 MED ORDER — ONDANSETRON HCL 4 MG PO TABS: 4.0000 mg | ORAL_TABLET | Freq: Four times a day (QID) | ORAL | Status: DC | PRN

## 2020-06-25 MED ORDER — CHLORHEXIDINE GLUCONATE 0.12 % MT SOLN
OROMUCOSAL | Status: AC
  Filled 2020-06-25: qty 15

## 2020-06-25 MED ORDER — HYDROMORPHONE HCL 1 MG/ML IJ SOLN: 0.5000 mg | INTRAMUSCULAR | Status: DC | PRN

## 2020-06-25 MED ORDER — PROPOFOL 10 MG/ML IV BOLUS
INTRAVENOUS | Status: DC | PRN
  Administered 2020-06-25: 120 mg via INTRAVENOUS

## 2020-06-25 MED ORDER — DEXAMETHASONE SODIUM PHOSPHATE 10 MG/ML IJ SOLN
INTRAMUSCULAR | Status: DC | PRN
  Administered 2020-06-25: 5 mg via INTRAVENOUS

## 2020-06-25 MED ORDER — FENTANYL CITRATE (PF) 100 MCG/2ML IJ SOLN
INTRAMUSCULAR | Status: AC
  Filled 2020-06-25: qty 2

## 2020-06-25 MED ORDER — ONDANSETRON HCL 4 MG/2ML IJ SOLN
INTRAMUSCULAR | Status: DC | PRN
  Administered 2020-06-25: 4 mg via INTRAVENOUS

## 2020-06-25 MED ORDER — BUPIVACAINE HCL (PF) 0.25 % IJ SOLN
INTRAMUSCULAR | Status: DC | PRN
  Administered 2020-06-25: 10 mL

## 2020-06-25 MED ORDER — OXYCODONE HCL 5 MG PO TABS: 5.0000 mg | ORAL_TABLET | ORAL | Status: DC | PRN

## 2020-06-25 MED ORDER — CHLORHEXIDINE GLUCONATE 4 % EX LIQD
60.0000 mL | Freq: Once | CUTANEOUS | Status: AC
  Administered 2020-06-25: 4 via TOPICAL

## 2020-06-25 MED ORDER — ONDANSETRON HCL 4 MG/2ML IJ SOLN
INTRAMUSCULAR | Status: AC
  Filled 2020-06-25: qty 2

## 2020-06-25 MED ORDER — CEFAZOLIN SODIUM-DEXTROSE 2-4 GM/100ML-% IV SOLN
2.0000 g | INTRAVENOUS | Status: AC
  Administered 2020-06-25: 2 g via INTRAVENOUS

## 2020-06-25 MED ORDER — BUPIVACAINE HCL (PF) 0.25 % IJ SOLN
INTRAMUSCULAR | Status: AC
  Filled 2020-06-25: qty 30

## 2020-06-25 MED ORDER — CEFAZOLIN SODIUM-DEXTROSE 2-4 GM/100ML-% IV SOLN
INTRAVENOUS | Status: AC
  Filled 2020-06-25: qty 100

## 2020-06-25 MED ORDER — ACETAMINOPHEN 10 MG/ML IV SOLN: 1000.0000 mg | Freq: Four times a day (QID) | INTRAVENOUS | Status: DC

## 2020-06-25 MED ORDER — ONDANSETRON HCL 4 MG/2ML IJ SOLN: 4.0000 mg | Freq: Four times a day (QID) | INTRAMUSCULAR | Status: DC | PRN

## 2020-06-25 MED ORDER — SUCCINYLCHOLINE CHLORIDE 20 MG/ML IJ SOLN
INTRAMUSCULAR | Status: DC | PRN
  Administered 2020-06-25: 100 mg via INTRAVENOUS

## 2020-06-25 MED ORDER — ACETAMINOPHEN 10 MG/ML IV SOLN
INTRAVENOUS | Status: DC | PRN
  Administered 2020-06-25: 1000 mg via INTRAVENOUS

## 2020-06-25 MED ORDER — LIDOCAINE HCL (CARDIAC) PF 100 MG/5ML IV SOSY
PREFILLED_SYRINGE | INTRAVENOUS | Status: DC | PRN
  Administered 2020-06-25: 60 mg via INTRAVENOUS

## 2020-06-25 MED ORDER — ACETAMINOPHEN 325 MG PO TABS: 325.0000 mg | ORAL_TABLET | Freq: Four times a day (QID) | ORAL | Status: DC | PRN

## 2020-06-25 MED ORDER — NEOMYCIN-POLYMYXIN B GU 40-200000 IR SOLN
Status: DC | PRN
  Administered 2020-06-25: 2 mL

## 2020-06-25 MED ORDER — LIDOCAINE HCL (PF) 2 % IJ SOLN
INTRAMUSCULAR | Status: AC
  Filled 2020-06-25: qty 5

## 2020-06-25 MED ORDER — FENTANYL CITRATE (PF) 100 MCG/2ML IJ SOLN
25.0000 ug | INTRAMUSCULAR | Status: DC | PRN
  Administered 2020-06-25 (×5): 25 ug via INTRAVENOUS

## 2020-06-25 MED ORDER — NEOMYCIN-POLYMYXIN B GU 40-200000 IR SOLN
Status: AC
  Filled 2020-06-25: qty 2

## 2020-06-25 MED ORDER — SUCCINYLCHOLINE CHLORIDE 200 MG/10ML IV SOSY
PREFILLED_SYRINGE | INTRAVENOUS | Status: AC
  Filled 2020-06-25: qty 10

## 2020-06-25 MED ORDER — FENTANYL CITRATE (PF) 100 MCG/2ML IJ SOLN
INTRAMUSCULAR | Status: DC | PRN
  Administered 2020-06-25 (×2): 50 ug via INTRAVENOUS

## 2020-06-25 MED ORDER — ENSURE PRE-SURGERY PO LIQD
296.0000 mL | Freq: Once | ORAL | Status: AC
  Administered 2020-06-25: 296 mL via ORAL
  Filled 2020-06-25: qty 296

## 2020-06-25 MED ORDER — HYDROCODONE-ACETAMINOPHEN 5-325 MG PO TABS
ORAL_TABLET | ORAL | Status: AC
  Administered 2020-06-25: 1
  Filled 2020-06-25: qty 1

## 2020-06-25 MED ORDER — CHLORHEXIDINE GLUCONATE 0.12 % MT SOLN
15.0000 mL | Freq: Once | OROMUCOSAL | Status: AC
  Administered 2020-06-25: 15 mL via OROMUCOSAL

## 2020-06-25 MED ORDER — ORAL CARE MOUTH RINSE: 15.0000 mL | Freq: Once | OROMUCOSAL | Status: AC

## 2020-06-25 MED ORDER — ONDANSETRON HCL 4 MG/2ML IJ SOLN: 4.0000 mg | Freq: Once | INTRAMUSCULAR | Status: DC | PRN

## 2020-06-25 MED ORDER — DEXAMETHASONE SODIUM PHOSPHATE 10 MG/ML IJ SOLN
INTRAMUSCULAR | Status: AC
  Filled 2020-06-25: qty 1

## 2020-06-25 MED ORDER — LACTATED RINGERS IV SOLN: INTRAVENOUS | Status: DC

## 2020-06-25 MED ORDER — HYDROCODONE-ACETAMINOPHEN 5-325 MG PO TABS
1.0000 | ORAL_TABLET | ORAL | 0 refills | Status: DC | PRN
Start: 2020-06-25 — End: 2022-05-06

## 2020-06-25 SURGICAL SUPPLY — 34 items
BNDG COHESIVE 4X5 TAN STRL (GAUZE/BANDAGES/DRESSINGS) ×3 IMPLANT
BNDG ELASTIC 4X5.8 VLCR STR LF (GAUZE/BANDAGES/DRESSINGS) ×3 IMPLANT
BNDG ESMARK 6X12 TAN STRL LF (GAUZE/BANDAGES/DRESSINGS) ×3 IMPLANT
CANISTER SUCT 1200ML W/VALVE (MISCELLANEOUS) ×1 IMPLANT
COVER WAND RF STERILE (DRAPES) ×1 IMPLANT
CUFF TOURN SGL QUICK 24 (TOURNIQUET CUFF) ×3
CUFF TOURN SGL QUICK 30 (TOURNIQUET CUFF)
CUFF TRNQT CYL 24X4X16.5-23 (TOURNIQUET CUFF) IMPLANT
CUFF TRNQT CYL 30X4X21-28X (TOURNIQUET CUFF) IMPLANT
DRAPE FLUOR MINI C-ARM 54X84 (DRAPES) ×3 IMPLANT
DRSG DERMACEA 8X12 NADH (GAUZE/BANDAGES/DRESSINGS) ×3 IMPLANT
DURAPREP 26ML APPLICATOR (WOUND CARE) ×3 IMPLANT
ELECT CAUTERY BLADE 6.4 (BLADE) ×3 IMPLANT
ELECT REM PT RETURN 9FT ADLT (ELECTROSURGICAL) ×3
ELECTRODE REM PT RTRN 9FT ADLT (ELECTROSURGICAL) ×1 IMPLANT
GAUZE SPONGE 4X4 12PLY STRL (GAUZE/BANDAGES/DRESSINGS) ×3 IMPLANT
GLOVE BIOGEL M STRL SZ7.5 (GLOVE) ×3 IMPLANT
GOWN STRL REUS W/ TWL LRG LVL3 (GOWN DISPOSABLE) ×2 IMPLANT
GOWN STRL REUS W/TWL LRG LVL3 (GOWN DISPOSABLE) ×6
KIT TURNOVER KIT A (KITS) ×3 IMPLANT
LABEL OR SOLS (LABEL) ×3 IMPLANT
MANIFOLD NEPTUNE II (INSTRUMENTS) ×3 IMPLANT
PACK EXTREMITY (MISCELLANEOUS) ×2 IMPLANT
PACK TOTAL KNEE (MISCELLANEOUS) IMPLANT
PAD ABD DERMACEA PRESS 5X9 (GAUZE/BANDAGES/DRESSINGS) ×3 IMPLANT
PAD CAST CTTN 4X4 STRL (SOFTGOODS) ×3 IMPLANT
PADDING CAST COTTON 4X4 STRL (SOFTGOODS) ×3
SOL PREP PVP 2OZ (MISCELLANEOUS)
SOLUTION PREP PVP 2OZ (MISCELLANEOUS) ×1 IMPLANT
STAPLER SKIN PROX 35W (STAPLE) ×3 IMPLANT
STOCKINETTE STRL 6IN 960660 (GAUZE/BANDAGES/DRESSINGS) ×3 IMPLANT
SUT VIC AB 0 CT1 36 (SUTURE) ×6 IMPLANT
SUT VIC AB 2-0 SH 27 (SUTURE) ×3
SUT VIC AB 2-0 SH 27XBRD (SUTURE) ×1 IMPLANT

## 2020-06-25 NOTE — H&P (Signed)
The patient has been re-examined, and the chart reviewed, and there have been no interval changes to the documented history and physical.    The risks, benefits, and alternatives have been discussed at length. The patient expressed understanding of the risks benefits and agreed with plans for surgical intervention.  [name], [name] M.D.    

## 2020-06-25 NOTE — Anesthesia Postprocedure Evaluation (Signed)
Anesthesia Post Note  Patient: Sara Hendrix  Procedure(s) Performed: HARDWARE REMOVAL (Right Ankle)  Patient location during evaluation: PACU Anesthesia Type: General Level of consciousness: awake and alert Pain management: pain level controlled Vital Signs Assessment: post-procedure vital signs reviewed and stable Respiratory status: spontaneous breathing, nonlabored ventilation, respiratory function stable and patient connected to nasal cannula oxygen Cardiovascular status: blood pressure returned to baseline and stable Postop Assessment: no apparent nausea or vomiting Anesthetic complications: no   No complications documented.   Last Vitals:  Vitals:   06/25/20 1752 06/25/20 1809  BP: (!) 143/48 125/72  Pulse: 78 81  Resp: 12 17  Temp: (!) 36.3 C (!) 36.3 C  SpO2: 97% 99%    Last Pain:  Vitals:   06/25/20 1810  TempSrc:   PainSc: [place] Izmael Duross

## 2020-06-25 NOTE — Transfer of Care (Signed)
Immediate Anesthesia Transfer of Care Note  Patient: Sara Hendrix  Procedure(s) Performed: HARDWARE REMOVAL (Right Ankle)  Patient Location: PACU  Anesthesia Type:General  Level of Consciousness: drowsy  Airway & Oxygen Therapy: Patient Spontanous Breathing and Patient connected to face mask oxygen  Post-op Assessment: Report given to RN  Post vital signs: stable  Last Vitals:  Vitals Value Taken Time  BP 137/61 06/25/20 1718  Temp    Pulse 81 06/25/20 1720  Resp 21 06/25/20 1720  SpO2 100 % 06/25/20 1720  Vitals shown include unvalidated device data.  Last Pain:  Vitals:   06/25/20 1338  TempSrc: Temporal  PainSc: 0-No pain         Complications: No complications documented.

## 2020-06-25 NOTE — Op Note (Signed)
OPERATIVE NOTE  DATE OF SURGERY:  06/25/2020  PATIENT NAME:  Sara Hendrix   DOB: [birth year]  MRN: [id number]   PRE-OPERATIVE DIAGNOSIS: Retained hardware of the right ankle status post open reduction and internal fixation of a right bimalleolar ankle fracture  POST-OPERATIVE DIAGNOSIS:  Same  PROCEDURE: Removal of retained hardware from the right ankle  SURGEON:  Marciano Sequin., M.D.   ANESTHESIA: general  ESTIMATED BLOOD LOSS: Minimal  FLUIDS REPLACED: 500 mL of crystalloid  TOURNIQUET TIME: 43 minutes  DRAINS: None  INDICATIONS FOR SURGERY: [name] is a 79 y.o. year old female who previously underwent open reduction internal fixation of a right bimalleolar ankle fracture.  She has had some persistent discomfort and several episodes of drainage from the lateral incision site.  Radiographs demonstrated bony consolidation of the distal fibula.  It was elected to proceed with removal of the retained hardware.  After discussion of the risks and benefits of surgical intervention, the patient expressed understanding of the risks benefits and agree with plans for removal of hardware from the right ankle.   PROCEDURE IN DETAIL: The patient was brought into the operating room and, after adequate general anesthesia was achieved, tourniquet was placed on the patient's upper right thigh.  Patient's right foot and ankle were cleaned and prepped with alcohol and DuraPrep and draped in usual sterile fashion.  A "timeout" was performed as per usual protocol.  The right lower extremity was exsanguinated using an Esmarch, and the tourniquet was inflated to 300 mmHg.  A lateral longitudinal incision was made in line with the previous surgical scar over the distal fibula.  Dissection was carried down to the plate.  Periosteal elevator was used to better isolate the margins of the plate.  A total of nine 3.5 millimeter screws were removed without difficulty.  The plate was then removed  without difficulty.  There appeared to be good bony consolidation at the previous fracture site.  No evidence of purulence or abscess formation.  The wound was irrigated with copious amounts of normal saline with antibiotic solution.  The wound was reapproximated in layers using first #0 Vicryl followed by #2-0 Vicryl.  The skin was closed with skin staples.  A sterile dressing was applied.  The tourniquet was deflated after total tourniquet time of 43 minutes.  The patient tolerated the procedure well.  She was transported to the recovery room stable condition   Hutchinson Isenberg P. Holley Bouche M.D.

## 2020-06-25 NOTE — Anesthesia Procedure Notes (Signed)
Procedure Name: Intubation Date/Time: 06/25/2020 4:03 PM Performed by: Lerry Liner, CRNA Pre-anesthesia Checklist: Patient identified, Emergency Drugs available, Suction available and Patient being monitored Patient Re-evaluated:Patient Re-evaluated prior to induction Oxygen Delivery Method: Circle system utilized Preoxygenation: Pre-oxygenation with 100% oxygen Induction Type: IV induction Ventilation: Mask ventilation without difficulty Grade View: Grade I Tube type: Oral Tube size: 7.0 mm Number of attempts: 1 Airway Equipment and Method: Stylet and Oral airway Placement Confirmation: ETT inserted through vocal cords under direct vision,  positive ETCO2 and breath sounds checked- equal and bilateral Secured at: 20 cm Tube secured with: Tape Dental Injury: Teeth and Oropharynx as per pre-operative assessment

## 2020-06-25 NOTE — Discharge Instructions (Addendum)
   [facility] Department Directory         [email or web address]       [email or web address]          Cardiology  Appointments: [place] [phone]  Endocrinology  Appointments: [place] [phone] [place] [phone]  Gastroenterology  Appointments: [place] [phone] [place] [phone]        General Surgery   Appointments: [facility]  Internal Medicine/Family Medicine  Appointments: [facility] [place] - [phone] [employer] [phone]  Metabolic and [place]aver Loss Surgery  Appointments: [facility]        Neurology  Appointments: [place] [phone] [place] - [phone]  Neurosurgery  Appointments: [place]  Obstetrics & Gynecology  Appointments: [place] [phone] [place] - [phone]        Pediatrics  Appointments: [place] [phone] [place] - [phone]  Physiatry  Appointments: [place] [phone]  Physical Therapy  Appointments: [place] [phone]        Podiatry  Appointments: [place] [phone] [place] - [phone]  Pulmonology  Appointments: [place]  Rheumatology  Appointments: [place] [phone]        [place] Location: [facility]  [place], [place]  [address]  [place] Location: [facility] [address], [place]  [address]  [place] Location: [facility] [address] [place], [place]  [address]     AMBULATORY SURGERY  DISCHARGE INSTRUCTIONS   1) The drugs that you were given will stay in your system until tomorrow so for the next 24 hours you should not:  A) Drive an automobile B) Make any legal decisions C) Drink any alcoholic beverage   2) You may resume regular meals tomorrow.  Today it is better to start with liquids and gradually work up to solid  foods.  You may eat anything you prefer, but it is better to start with liquids, then soup and crackers, and gradually work up to solid foods.   3) Please notify your doctor immediately if you have any unusual bleeding, trouble breathing, redness and pain at the surgery site, drainage, fever, or pain not relieved by medication.    4) Additional Instructions:        Please contact your physician with any problems or Same Day Surgery at [phone], Monday through Friday 6 am to 4 pm, or [place] at [facility] number at [phone].

## 2020-06-25 NOTE — Anesthesia Preprocedure Evaluation (Signed)
Anesthesia Evaluation  Patient identified by MRN, date of birth, ID band Patient awake    Reviewed: Allergy & Precautions, H&P , NPO status , Patient's Chart, lab work & pertinent test results, reviewed documented beta blocker date and time   History of Anesthesia Complications Negative for: history of anesthetic complications  Airway Mallampati: II  TM Distance: >3 FB Neck ROM: full    Dental  (+) Edentulous Upper, Edentulous Lower, Dental Advidsory Given   Pulmonary neg pulmonary ROS,    Pulmonary exam normal        Cardiovascular hypertension, (-) angina(-) Past MI and (-) Cardiac Stents Normal cardiovascular exam(-) dysrhythmias (-) Valvular Problems/Murmurs     Neuro/Psych negative neurological ROS  negative psych ROS   GI/Hepatic Neg liver ROS, GERD  ,  Endo/Other  negative endocrine ROS  Renal/GU negative Renal ROS  negative genitourinary   Musculoskeletal   Abdominal   Peds  Hematology negative hematology ROS (+)   Anesthesia Other Findings History reviewed. No pertinent past medical history.   Reproductive/Obstetrics negative OB ROS                             Anesthesia Physical  Anesthesia Plan  ASA: II  Anesthesia Plan: General   Post-op Pain Management:    Induction: Intravenous  PONV Risk Score and Plan: 3 and Ondansetron, Dexamethasone and Treatment may vary due to age or medical condition  Airway Management Planned: Oral ETT  Additional Equipment:   Intra-op Plan:   Post-operative Plan: Extubation in OR  Informed Consent: I have reviewed the patients History and Physical, chart, labs and discussed the procedure including the risks, benefits and alternatives for the proposed anesthesia with the patient or authorized representative who has indicated his/her understanding and acceptance.     Dental Advisory Given  Plan Discussed with: Anesthesiologist, CRNA  and Surgeon  Anesthesia Plan Comments:         Anesthesia Quick Evaluation

## 2020-06-26 ENCOUNTER — Encounter: Payer: Self-pay | Admitting: Orthopedic Surgery

## 2020-08-01 ENCOUNTER — Ambulatory Visit: Payer: Medicare HMO | Admitting: Dermatology

## 2020-08-01 ENCOUNTER — Other Ambulatory Visit: Payer: Self-pay

## 2020-08-01 DIAGNOSIS — L821 Other seborrheic keratosis: Secondary | ICD-10-CM

## 2020-08-01 DIAGNOSIS — L578 Other skin changes due to chronic exposure to nonionizing radiation: Secondary | ICD-10-CM | POA: Diagnosis not present

## 2020-08-01 DIAGNOSIS — D485 Neoplasm of uncertain behavior of skin: Secondary | ICD-10-CM

## 2020-08-01 NOTE — Patient Instructions (Addendum)
Seborrheic Keratosis  What causes seborrheic keratoses? Seborrheic keratoses are harmless, common skin growths that first appear during adult life.  As time goes by, more growths appear.  Some people may develop a large number of them.  Seborrheic keratoses appear on both covered and uncovered body parts.  They are not caused by sunlight.  The tendency to develop seborrheic keratoses can be inherited.  They vary in color from skin-colored to gray, brown, or even black.  They can be either smooth or have a rough, warty surface.   Seborrheic keratoses are superficial and look as if they were stuck on the skin.  Under the microscope this type of keratosis looks like layers upon layers of skin.  That is why at times the top layer may seem to fall off, but the rest of the growth remains and re-grows.    Treatment Seborrheic keratoses do not need to be treated, but can easily be removed in the office.  Seborrheic keratoses often cause symptoms when they rub on clothing or jewelry.  Lesions can be in the way of shaving.  If they become inflamed, they can cause itching, soreness, or burning.  Removal of a seborrheic keratosis can be accomplished by freezing, burning, or surgery. If any spot bleeds, scabs, or grows rapidly, please return to have it checked, as these can be an indication of a skin cancer.   Melanoma ABCDEs  Melanoma is the most dangerous type of skin cancer, and is the leading cause of death from skin disease.  You are more likely to develop melanoma if you:  Have light-colored skin, light-colored eyes, or red or blond hair  Spend a lot of time in the sun  Tan regularly, either outdoors or in a tanning bed  Have had blistering sunburns, especially during childhood  Have a close family member who has had a melanoma  Have atypical moles or large birthmarks  Early detection of melanoma is key since treatment is typically straightforward and cure rates are extremely high if we catch it  early.   The first sign of melanoma is often a change in a mole or a new dark spot.  The ABCDE system is a way of remembering the signs of melanoma.  A for asymmetry:  The two halves do not match. B for border:  The edges of the growth are irregular. C for color:  A mixture of colors are present instead of an even brown color. D for diameter:  Melanomas are usually (but not always) greater than 6mm - the size of a pencil eraser. E for evolution:  The spot keeps changing in size, shape, and color.  Please check your skin once per month between visits. You can use a small mirror in front and a large mirror behind you to keep an eye on the back side or your body.   If you see any new or changing lesions before your next follow-up, please call to schedule a visit.  Please continue daily skin protection including broad spectrum sunscreen SPF 30+ to sun-exposed areas, reapplying every 2 hours as needed when you're outdoors.   

## 2020-08-01 NOTE — Progress Notes (Signed)
   New Patient Visit  Subjective  Sara Hendrix is a 80 y.o. female who presents for the following: Skin Problem (New patient here today to have rough spots at mid chest, right hand and left neck looked at. Spot at left neck sometimes itches and has been there for a few months. No history of skin cancer. ).  Patient accompanied by husband.   The following portions of the chart were reviewed this encounter and updated as appropriate:   Tobacco  Allergies  Meds  Problems  Med Hx  Surg Hx  Fam Hx      Review of Systems:  No other skin or systemic complaints except as noted in HPI or Assessment and Plan.  Objective  Well appearing patient in no apparent distress; mood and affect are within normal limits.  A focused examination was performed including face, neck, chest, hands. Relevant physical exam findings are noted in the Assessment and Plan.  Objective  Left lateral neck: Erythematous tan plaque 3.6cm x 2.0cm      Assessment & Plan  Neoplasm of uncertain behavior of skin Left lateral neck  Favor inflamed seborrheic keratosis.  Treatment deferred today.  Benign-appearing.  Recheck on follow-up.  Call clinic for new or changing lesions.  Recommend daily use of broad spectrum spf 30+ sunscreen to sun-exposed areas.     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 - Observe - Call for any changes  Actinic Damage - chronic, secondary to cumulative UV radiation exposure/sun exposure over time - diffuse scaly erythematous macules with underlying dyspigmentation - Recommend daily broad spectrum sunscreen SPF 30+ to sun-exposed areas, reapply every 2 hours as needed.  - Call for new or changing lesions.  Return in about 3 months (around 10/30/2020) for recheck left neck.  Anise Salvo, RMA, am acting as scribe for Darden Dates, MD .  Documentation: I have reviewed the above  documentation for accuracy and completeness, and I agree with the above.  Darden Dates, MD

## 2020-08-21 ENCOUNTER — Encounter: Payer: Self-pay | Admitting: Dermatology

## 2020-10-16 ENCOUNTER — Other Ambulatory Visit: Payer: Self-pay | Admitting: Family Medicine

## 2020-10-16 DIAGNOSIS — Z1231 Encounter for screening mammogram for malignant neoplasm of breast: Secondary | ICD-10-CM

## 2020-10-31 ENCOUNTER — Ambulatory Visit: Payer: Medicare Other | Admitting: Dermatology

## 2020-11-13 ENCOUNTER — Other Ambulatory Visit: Payer: Self-pay | Admitting: Family Medicine

## 2020-11-13 DIAGNOSIS — Z13820 Encounter for screening for osteoporosis: Secondary | ICD-10-CM

## 2020-12-04 ENCOUNTER — Ambulatory Visit
Admission: RE | Admit: 2020-12-04 | Discharge: 2020-12-04 | Disposition: A | Discharge status: Home / Self Care | Payer: Medicare Other | Source: Ambulatory Visit | Attending: Family Medicine | Admitting: Family Medicine

## 2020-12-04 ENCOUNTER — Other Ambulatory Visit: Payer: Self-pay

## 2020-12-04 DIAGNOSIS — M810 Age-related osteoporosis without current pathological fracture: Secondary | ICD-10-CM | POA: Diagnosis not present

## 2020-12-04 DIAGNOSIS — Z13820 Encounter for screening for osteoporosis: Secondary | ICD-10-CM | POA: Diagnosis not present

## 2020-12-04 DIAGNOSIS — Z1231 Encounter for screening mammogram for malignant neoplasm of breast: Secondary | ICD-10-CM | POA: Diagnosis present

## 2020-12-04 DIAGNOSIS — Z780 Asymptomatic menopausal state: Secondary | ICD-10-CM | POA: Insufficient documentation

## 2021-11-26 ENCOUNTER — Other Ambulatory Visit: Payer: Self-pay | Admitting: Surgery

## 2021-11-26 DIAGNOSIS — M19112 Post-traumatic osteoarthritis, left shoulder: Secondary | ICD-10-CM

## 2021-12-07 ENCOUNTER — Ambulatory Visit
Admission: RE | Admit: 2021-12-07 | Discharge: 2021-12-07 | Disposition: A | Discharge status: Home / Self Care | Payer: Medicare Other | Source: Ambulatory Visit | Attending: Surgery | Admitting: Surgery

## 2021-12-07 DIAGNOSIS — M19112 Post-traumatic osteoarthritis, left shoulder: Secondary | ICD-10-CM | POA: Insufficient documentation

## 2022-01-05 ENCOUNTER — Ambulatory Visit
Admission: EM | Admit: 2022-01-05 | Discharge: 2022-01-05 | Disposition: A | Discharge status: Home / Self Care | Payer: Medicare Other | Attending: Emergency Medicine | Admitting: Emergency Medicine

## 2022-01-05 ENCOUNTER — Encounter: Payer: Self-pay | Admitting: Emergency Medicine

## 2022-01-05 DIAGNOSIS — H8110 Benign paroxysmal vertigo, unspecified ear: Secondary | ICD-10-CM | POA: Diagnosis not present

## 2022-01-05 DIAGNOSIS — I10 Essential (primary) hypertension: Secondary | ICD-10-CM | POA: Diagnosis not present

## 2022-01-05 MED ORDER — ONDANSETRON HCL 4 MG PO TABS: 4.0000 mg | ORAL_TABLET | Freq: Three times a day (TID) | ORAL | 0 refills | Status: DC | PRN

## 2022-01-05 MED ORDER — MECLIZINE HCL 25 MG PO TABS: 25.0000 mg | ORAL_TABLET | Freq: Three times a day (TID) | ORAL | 0 refills | Status: AC | PRN

## 2022-01-05 MED ORDER — LISINOPRIL 20 MG PO TABS: 20.0000 mg | ORAL_TABLET | Freq: Every day | ORAL | 0 refills | Status: AC

## 2022-01-05 NOTE — ED Triage Notes (Signed)
Pt reports dizziness, headache and elevated blood pressures. States the dizziness started around 5 pm yesterday. Reports does have hx of HTN and takes 10 mg Lisinopril daily. States have taken Tylenol and Ibuprofen for the headache.

## 2022-01-05 NOTE — ED Provider Notes (Signed)
[place]   MRN: [id number] DOB: 11-Aug-1940  Subjective:   Chief Complaint;  Chief Complaint  Patient presents with   Dizziness   Hypertension    [name] is a 81 y.o. female presenting for new onset in the last 24 hours of posterior headache nothing. , Sensation spinning with dizziness with changing position and mild nausea and elevated blood pressure despite taking her lisinopril 10 mg daily.  She denied vomiting, visual changes or recent trauma no history of vertigo  No current facility-administered medications for this encounter.  Current Outpatient Medications:    lisinopril (ZESTRIL) 20 MG tablet, Take 1 tablet (20 mg total) by mouth daily., Disp: 30 tablet, Rfl: 0   meclizine (ANTIVERT) 25 MG tablet, Take 1 tablet (25 mg total) by mouth 3 (three) times daily as needed for dizziness., Disp: 30 tablet, Rfl: 0   ondansetron (ZOFRAN) 4 MG tablet, Take 1 tablet (4 mg total) by mouth every 8 (eight) hours as needed for nausea or vomiting., Disp: 4 tablet, Rfl: 0   acetaminophen (TYLENOL) 500 MG tablet, Take 1,000 mg by mouth every 6 (six) hours as needed for moderate pain or headache., Disp: , Rfl:    aspirin EC 81 MG tablet, Take 81 mg by mouth daily. Swallow whole., Disp: , Rfl:    atorvastatin (LIPITOR) 20 MG tablet, Take 20 mg by mouth at bedtime. , Disp: , Rfl:    HYDROcodone-acetaminophen (NORCO) 5-325 MG tablet, Take 1-2 tablets by mouth every 4 (four) hours as needed for moderate pain., Disp: 12 tablet, Rfl: 0   magnesium oxide (MAG-OX) 400 MG tablet, Take 400 mg by mouth daily., Disp: , Rfl:    meloxicam (MOBIC) 15 MG tablet, Take 15 mg by mouth daily., Disp: , Rfl:    omeprazole (PRILOSEC) 20 MG capsule, Take 20 mg by mouth daily., Disp: , Rfl:    Allergies  Allergen Reactions   Codeine Other (See Comments)    Hallucinations    Past Medical History:  Diagnosis Date   GERD (gastroesophageal reflux disease)    Hypertension      Review  of Systems  All other systems reviewed and are negative.    Objective:   Vitals: BP (!) 180/82 (BP Location: Left Arm)   Pulse 78   Temp 97.6 F (36.4 C) (Oral)   Resp 18   SpO2 94%   Physical Exam Constitutional:      General: She is not in acute distress.    Appearance: Normal appearance. She is not toxic-appearing.  HENT:     Head: Atraumatic.     Right Ear: External ear normal.     Left Ear: External ear normal.     Nose: Nose normal.     Mouth/Throat:     Mouth: Mucous membranes are moist.  Eyes:     General: No scleral icterus.       Right eye: No discharge.        Left eye: No discharge.     Extraocular Movements: Extraocular movements intact.     Conjunctiva/sclera: Conjunctivae normal.     Pupils: Pupils are equal, round, and reactive to light.     Comments: no gross vision abnormalities- no nystagmus  Cardiovascular:     Rate and Rhythm: Normal rate and regular rhythm.  Pulmonary:     Effort: Pulmonary effort is normal. No respiratory distress.     Breath sounds: Normal breath sounds. No stridor. No wheezing, rhonchi or rales.  Chest:     Chest wall: No tenderness.  Abdominal:     General: There is no distension.     Palpations: Abdomen is soft.     Tenderness: There is no abdominal tenderness. There is no right CVA tenderness, left CVA tenderness, guarding or rebound.  Skin:    General: Skin is warm and dry.     Capillary Refill: Capillary refill takes less than 2 seconds.  Neurological:     General: No focal deficit present.     Mental Status: She is alert and oriented to person, place, and time.     Cranial Nerves: No cranial nerve deficit.     Coordination: Coordination abnormal.     Comments: Homans with eyes closed can stand steady for 6 seconds without supprot or significant wobbling  Psychiatric:        Mood and Affect: Mood normal.        Behavior: Behavior normal.     No results found for this or any previous visit (from the past 24  hour(s)).  No results found.     Assessment and Plan :   1. Benign paroxysmal positional vertigo, unspecified laterality   2. Primary hypertension     Meds ordered this encounter  Medications   meclizine (ANTIVERT) 25 MG tablet    Sig: Take 1 tablet (25 mg total) by mouth 3 (three) times daily as needed for dizziness.    Dispense:  30 tablet    Refill:  0   lisinopril (ZESTRIL) 20 MG tablet    Sig: Take 1 tablet (20 mg total) by mouth daily.    Dispense:  30 tablet    Refill:  0   DISCONTD: ondansetron (ZOFRAN) 4 MG tablet    Sig: Take 1 tablet (4 mg total) by mouth every 8 (eight) hours as needed for nausea or vomiting.    Dispense:  4 tablet    Refill:  0   ondansetron (ZOFRAN) 4 MG tablet    Sig: Take 1 tablet (4 mg total) by mouth every 8 (eight) hours as needed for nausea or vomiting.    Dispense:  4 tablet    Refill:  0    MDM:  Sara Hendrix is a 81 y.o. female presenting for elevated blood pressure despite taking her medications and dizziness that is described as spinning and worsening on positional change.  She has been afebrile no recent illness and has associated headache pain that begins posterior scalp and moves towards the front she denies visual changes and has had no vomiting but positive mild nausea.  On exam there are no neurologic deficits blood pressure was elevated 82.  Patient may be having symptoms from elevated uncontrolled blood pressure versus vertigo-like symptoms but I could not rule out a cerebellar event.  I prescribed meclizine and increased her blood pressure medication added antinausea as needed I spoke candidly with daughter to with the concern for possible stroke if the headache and dizziness symptoms persist with imbalance a CT/MRI may be helpful in further diagnosing this issue.  I discussed todays findings, treatment plan, follow up and return instructions. Questions were answered. Patient/representative stated understanding of the instructions  and patient is stable for discharge.  New onset of headache dizziness and uncontrolled hypertension Leida Lauth FNP-C MSN    Hezzie Bump, [place] 01/05/22 1517

## 2022-01-05 NOTE — Discharge Instructions (Addendum)
Your symptoms could be related to vertigo and uncontrolled blood pressure.  Increase your lisinopril to 20 mg daily and start logging your blood pressures.  Take meclizine as directed if not improving over the next 24 hours follow-up in the emergency room as I cannot rule out a cerebellar event take nausea medication if needed return in the next couple of weeks if your symptoms are better but your blood pressure still needs better control.  Goal blood pressure is 140/80 or less

## 2022-04-29 ENCOUNTER — Other Ambulatory Visit: Payer: Self-pay | Admitting: Surgery

## 2022-05-06 ENCOUNTER — Other Ambulatory Visit: Payer: Self-pay

## 2022-05-06 ENCOUNTER — Encounter
Admission: RE | Admit: 2022-05-06 | Discharge: 2022-05-06 | Disposition: A | Discharge status: Home / Self Care | Payer: Medicare Other | Source: Ambulatory Visit | Attending: Surgery | Admitting: Surgery

## 2022-05-06 VITALS — BP 128/67 | HR 73 | Resp 16 | Wt 160.7 lb

## 2022-05-06 DIAGNOSIS — S82841A Displaced bimalleolar fracture of right lower leg, initial encounter for closed fracture: Secondary | ICD-10-CM | POA: Diagnosis not present

## 2022-05-06 DIAGNOSIS — Z01818 Encounter for other preprocedural examination: Secondary | ICD-10-CM | POA: Insufficient documentation

## 2022-05-06 DIAGNOSIS — R8290 Unspecified abnormal findings in urine: Secondary | ICD-10-CM | POA: Diagnosis not present

## 2022-05-06 DIAGNOSIS — Z01812 Encounter for preprocedural laboratory examination: Secondary | ICD-10-CM

## 2022-05-06 DIAGNOSIS — X58XXXA Exposure to other specified factors, initial encounter: Secondary | ICD-10-CM | POA: Insufficient documentation

## 2022-05-06 HISTORY — DX: Unspecified osteoarthritis, unspecified site: M19.90

## 2022-05-06 HISTORY — DX: Prediabetes: R73.03

## 2022-05-06 LAB — CBC WITH DIFFERENTIAL/PLATELET
Abs Immature Granulocytes: 0.02 10*3/uL (ref 0.00–0.07)
Basophils Absolute: 0 10*3/uL (ref 0.0–0.1)
Basophils Relative: 0 %
Eosinophils Absolute: 0.1 10*3/uL (ref 0.0–0.5)
Eosinophils Relative: 1 %
HCT: 37 % (ref 36.0–46.0)
Hemoglobin: 12 g/dL (ref 12.0–15.0)
Immature Granulocytes: 0 %
Lymphocytes Relative: 21 %
Lymphs Abs: 1.6 10*3/uL (ref 0.7–4.0)
MCH: 30.5 pg (ref 26.0–34.0)
MCHC: 32.4 g/dL (ref 30.0–36.0)
MCV: 94.1 fL (ref 80.0–100.0)
Monocytes Absolute: 0.6 10*3/uL (ref 0.1–1.0)
Monocytes Relative: 7 %
Neutro Abs: 5.5 10*3/uL (ref 1.7–7.7)
Neutrophils Relative %: 71 %
Platelets: 184 10*3/uL (ref 150–400)
RBC: 3.93 MIL/uL (ref 3.87–5.11)
RDW: 12.9 % (ref 11.5–15.5)
WBC: 7.8 10*3/uL (ref 4.0–10.5)
nRBC: 0 % (ref 0.0–0.2)

## 2022-05-06 LAB — URINALYSIS, ROUTINE W REFLEX MICROSCOPIC
Bilirubin Urine: NEGATIVE
Glucose, UA: NEGATIVE mg/dL
Ketones, ur: NEGATIVE mg/dL
Nitrite: NEGATIVE
Protein, ur: NEGATIVE mg/dL
Specific Gravity, Urine: 1.01 (ref 1.005–1.030)
pH: 6 (ref 5.0–8.0)

## 2022-05-06 LAB — COMPREHENSIVE METABOLIC PANEL
ALT: 12 U/L (ref 0–44)
AST: 13 U/L — ABNORMAL LOW (ref 15–41)
Albumin: 4.1 g/dL (ref 3.5–5.0)
Alkaline Phosphatase: 82 U/L (ref 38–126)
Anion gap: 4 — ABNORMAL LOW (ref 5–15)
BUN: 23 mg/dL (ref 8–23)
CO2: 28 mmol/L (ref 22–32)
Calcium: 9.9 mg/dL (ref 8.9–10.3)
Chloride: 107 mmol/L (ref 98–111)
Creatinine, Ser: 0.88 mg/dL (ref 0.44–1.00)
GFR, Estimated: >60 mL/min (ref >60)
Glucose, Bld: 86 mg/dL (ref 70–99)
Potassium: 3.8 mmol/L (ref 3.5–5.1)
Sodium: 139 mmol/L (ref 135–145)
Total Bilirubin: 0.9 mg/dL (ref 0.3–1.2)
Total Protein: 7.3 g/dL (ref 6.5–8.1)

## 2022-05-06 LAB — TYPE AND SCREEN
ABO/RH(D): O POS
Antibody Screen: NEGATIVE

## 2022-05-06 LAB — SURGICAL PCR SCREEN
MRSA, PCR: NEGATIVE
Staphylococcus aureus: NEGATIVE

## 2022-05-06 NOTE — Patient Instructions (Addendum)
Your procedure is scheduled on: 05/15/22 - Thursday Report to the Registration Desk on the 1st floor of the [facility]. To find out your arrival time, please call [phone] between 1PM - 3PM on: 05/14/22 - Wednesday If your arrival time is 6:00 am, do not arrive prior to that time as the [facility] entrance doors do not open until 6:00 am.  REMEMBER: Instructions that are not followed completely may result in serious medical risk, up to and including death; or upon the discretion of your surgeon and anesthesiologist your surgery may need to be rescheduled.  Do not eat food after midnight the night before surgery.  No gum chewing, lozengers or hard candies.  You may however, drink CLEAR liquids up to 2 hours before you are scheduled to arrive for your surgery. Do not drink anything within 2 hours of your scheduled arrival time.  Clear liquids include: - water  - apple juice without pulp - gatorade (not RED colors) - black coffee or tea (Do NOT add milk or creamers to the coffee or tea) Do NOT drink anything that is not on this list.  In addition, your doctor has ordered for you to drink the provided  Ensure Pre-Surgery Clear Carbohydrate Drink  Drinking this carbohydrate drink up to two hours before surgery helps to reduce insulin resistance and improve patient outcomes. Please complete drinking 2 hours prior to scheduled arrival time.  TAKE THESE MEDICATIONS THE MORNING OF SURGERY WITH A SIP OF WATER:  - omeprazole (PRILOSEC) - (take one the night before and one on the morning of surgery - helps to prevent nausea after surgery.)   Hold One week prior to surgery: meloxicam (MOBIC), Voltaren Gel. Stop Anti-inflammatories (NSAIDS) such as Advil, Aleve, Ibuprofen, Motrin, Naproxen, Naprosyn and Aspirin based products such as Excedrin, Goodys Powder, BC Powder.  HOLD ASPIRIN 81 mg 5 DAYS PRIOR TO YOUR SURGERY.  Stop ANY OVER THE COUNTER supplements until after surgery.  You may  however, continue to take Tylenol if needed for pain up until the day of surgery.  No Alcohol for 24 hours before or after surgery.  No Smoking including e-cigarettes for 24 hours prior to surgery.  No chewable tobacco products for at least 6 hours prior to surgery.  No nicotine patches on the day of surgery.  Do not use any "recreational" drugs for at least a week prior to your surgery.  Please be advised that the combination of cocaine and anesthesia may have negative outcomes, up to and including death. If you test positive for cocaine, your surgery will be cancelled.  On the morning of surgery brush your teeth with toothpaste and water, you may rinse your mouth with mouthwash if you wish. Do not swallow any toothpaste or mouthwash.  Use CHG Soap or wipes as directed on instruction sheet.  Do not wear jewelry, make-up, hairpins, clips or nail polish.  Do not wear lotions, powders, or perfumes.   Do not shave body from the neck down 48 hours prior to surgery just in case you cut yourself which could leave a site for infection.  Also, freshly shaved skin may become irritated if using the CHG soap.  Contact lenses, hearing aids and dentures may not be worn into surgery.  Do not bring valuables to the hospital. [facility] is not responsible for any missing/lost belongings or valuables.   Total Shoulder Arthroplasty:  use Benzolyl Peroxide 5% Gel as directed on instruction sheet.  Notify your doctor if there is  any change in your medical condition (cold, fever, infection).  Wear comfortable clothing (specific to your surgery type) to the hospital.  After surgery, you can help prevent lung complications by doing breathing exercises.  Take deep breaths and cough every 1-2 hours. Your doctor may order a device called an Incentive Spirometer to help you take deep breaths. When coughing or sneezing, hold a pillow firmly against your incision with both hands. This is called "splinting."  Doing this helps protect your incision. It also decreases belly discomfort.  If you are being admitted to the hospital overnight, leave your suitcase in the car. After surgery it may be brought to your room.  If you are being discharged the day of surgery, you will not be allowed to drive home. You will need a responsible adult (18 years or older) to drive you home and stay with you that night.   If you are taking public transportation, you will need to have a responsible adult (18 years or older) with you. Please confirm with your physician that it is acceptable to use public transportation.   Please call the [place] Dept[facility] at [phone] if you have any questions about these instructions.  Surgery Visitation Policy:  Patients undergoing a surgery or procedure may have two family members or support persons with them as long as the person is not COVID-19 positive or experiencing its symptoms.   Inpatient Visitation:    Visiting hours are 7 a.m. to 8 p.m. Up to four visitors are allowed at one time in a patient room, including children. The visitors may rotate out with other people during the day. One designated support person (adult) may remain overnight.

## 2022-05-08 ENCOUNTER — Telehealth: Payer: Self-pay | Admitting: Urgent Care

## 2022-05-08 DIAGNOSIS — B9620 Unspecified Escherichia coli [E. coli] as the cause of diseases classified elsewhere: Secondary | ICD-10-CM

## 2022-05-08 DIAGNOSIS — Z01812 Encounter for preprocedural laboratory examination: Secondary | ICD-10-CM

## 2022-05-08 LAB — URINE CULTURE: Culture: >=100000 — AB

## 2022-05-08 MED ORDER — CEPHALEXIN 500 MG PO CAPS: 500.0000 mg | ORAL_CAPSULE | Freq: Two times a day (BID) | ORAL | 0 refills | Status: DC

## 2022-05-08 NOTE — Progress Notes (Signed)
[facility] Perioperative Services: Pre-Admission/Anesthesia Testing  Abnormal Lab Notification and Treatment Plan of Care   Date: 05/08/22  Name: Sara Hendrix MRN:   [id number]  Re: Abnormal labs noted during PAT appointment   Notified:  Provider Name Provider Role Notification Mode  Poggi, Jenny Reichmann, MD Orthopedics (Surgeon) Routed and/or faxed via [facility]   Abnormal Lab Value(s):   Lab Results  Component Value Date   COLORURINE YELLOW (A) 05/06/2022   APPEARANCEUR CLEAR (A) 05/06/2022   LABSPEC 1.010 05/06/2022   PHURINE 6.0 05/06/2022   GLUCOSEU NEGATIVE 05/06/2022   HGBUR SMALL (A) 05/06/2022   BILIRUBINUR NEGATIVE 05/06/2022   KETONESUR NEGATIVE 05/06/2022   PROTEINUR NEGATIVE 05/06/2022   NITRITE NEGATIVE 05/06/2022   LEUKOCYTESUR TRACE (A) 05/06/2022   EPIU 0-5 05/06/2022   WBCU 6-10 05/06/2022   RBCU 0-5 05/06/2022   BACTERIA MANY (A) 05/06/2022   CULT >=100,000 COLONIES/mL ESCHERICHIA COLI (A) 05/06/2022    Clinical Information and Notes:  Patient is scheduled for an elective LEFT REVERSE SHOULDER ARTHROPLASTY WITH BICEPS TENODESIS on 10/19//2023  UA performed in PAT consistent with/concerning for infection.  No leukocytosis noted on CBC; WBC 7800 Renal function: BUN 23 and creatinine 0.88 (eGFR >60 mL/min) Urine C&S added to assess for pathogenically significant growth.   Impression and Plan:  Shandiin B Poage with a UA that was (+) for infection; reflex culture sent that grew out a significant Escherichia coli colony count. Contacted patient to discuss. Patient with mild frequency and urgency, however denies dysuria, abdominal/back pain, nausea, vomiting, fever, and chills. Patient with surgery scheduled soon. In efforts to avoid delaying patient's procedure, or have her experience any potentially significant perioperative complications related to the aforementioned, I would like to proceed with empiric treatment for urinary tract  infection.  Allergies reviewed. Culture report also reviewed to ensure culture appropriate coverage is being provided. Resistance to ampicillin, cipro, and SMZ-TMP noted. Will treat with a 7 day course of CEPHALEXIN. Patient encouraged to complete the entire course of antibiotics even if she begins to feel better.  Meds ordered this encounter  Medications   cephALEXin (KEFLEX) 500 MG capsule    Sig: Take 1 capsule (500 mg total) by mouth 2 (two) times daily for 7 days. Increase WATER intake while taking this medication.    Dispense:  14 capsule    Refill:  0   Patient encouraged to increase her fluid intake as much as possible. Discussed that water is always best to flush the urinary tract. She was advised to avoid caffeine containing fluids until her infections clears, as caffeine can cause her to experience painful bladder spasms.   May use Tylenol as needed for pain/fever should she experience these symptoms.   Patient instructed to call surgeon's office or PAT with any questions or concerns related to the above outlined course of treatment. Additionally, she was instructed to call if she feels like she is getting worse overall while on treatment. Results and treatment plan of care forwarded to primary attending surgeon to make them aware.   Encounter Diagnoses  Name Primary?   Pre-operative laboratory examination Yes   E. coli UTI (urinary tract infection)    Honor Loh, MSN, APRN, FNP-C, CEN [facility]  Peri-operative Services Nurse Practitioner Phone: [phone] Fax: [phone] 05/08/22 8:45 AM  NOTE: This note has been prepared using Dragon dictation software. Despite my best ability to proofread, there is always the potential that unintentional transcriptional errors may still occur  from this process.

## 2022-05-15 ENCOUNTER — Other Ambulatory Visit: Payer: Self-pay

## 2022-05-15 ENCOUNTER — Ambulatory Visit: Payer: Medicare Other

## 2022-05-15 ENCOUNTER — Ambulatory Visit: Payer: Medicare Other | Admitting: Urgent Care

## 2022-05-15 ENCOUNTER — Encounter: Payer: Self-pay | Admitting: Surgery

## 2022-05-15 ENCOUNTER — Encounter
Admission: RE | Disposition: A | Discharge status: Home / Self Care | Payer: Self-pay | Source: Home / Self Care | Attending: Surgery

## 2022-05-15 ENCOUNTER — Ambulatory Visit
Admission: RE | Admit: 2022-05-15 | Discharge: 2022-05-15 | Disposition: A | Discharge status: Home / Self Care | Payer: Medicare Other | Attending: Surgery | Admitting: Surgery

## 2022-05-15 ENCOUNTER — Ambulatory Visit: Payer: Medicare Other | Admitting: Certified Registered"

## 2022-05-15 DIAGNOSIS — I10 Essential (primary) hypertension: Secondary | ICD-10-CM | POA: Diagnosis not present

## 2022-05-15 DIAGNOSIS — M19012 Primary osteoarthritis, left shoulder: Secondary | ICD-10-CM | POA: Insufficient documentation

## 2022-05-15 DIAGNOSIS — R7303 Prediabetes: Secondary | ICD-10-CM | POA: Diagnosis not present

## 2022-05-15 DIAGNOSIS — K219 Gastro-esophageal reflux disease without esophagitis: Secondary | ICD-10-CM | POA: Insufficient documentation

## 2022-05-15 HISTORY — PX: REVERSE SHOULDER ARTHROPLASTY: SHX5054

## 2022-05-15 LAB — ABO/RH: ABO/RH(D): O POS

## 2022-05-15 SURGERY — ARTHROPLASTY, SHOULDER, TOTAL, REVERSE
Anesthesia: General | Site: Shoulder | Laterality: Left

## 2022-05-15 MED ORDER — ROCURONIUM BROMIDE 10 MG/ML (PF) SYRINGE
PREFILLED_SYRINGE | INTRAVENOUS | Status: AC
  Filled 2022-05-15: qty 10

## 2022-05-15 MED ORDER — TRANEXAMIC ACID 1000 MG/10ML IV SOLN
INTRAVENOUS | Status: DC | PRN
  Administered 2022-05-15: 10 mg via INTRAVENOUS

## 2022-05-15 MED ORDER — ROCURONIUM BROMIDE 100 MG/10ML IV SOLN
INTRAVENOUS | Status: DC | PRN
  Administered 2022-05-15: 20 mg via INTRAVENOUS
  Administered 2022-05-15: 10 mg via INTRAVENOUS
  Administered 2022-05-15: 20 mg via INTRAVENOUS

## 2022-05-15 MED ORDER — DROPERIDOL 2.5 MG/ML IJ SOLN: 0.6250 mg | Freq: Once | INTRAMUSCULAR | Status: DC | PRN

## 2022-05-15 MED ORDER — FENTANYL CITRATE PF 50 MCG/ML IJ SOSY: 50.0000 ug | PREFILLED_SYRINGE | Freq: Once | INTRAMUSCULAR | Status: AC

## 2022-05-15 MED ORDER — LIDOCAINE HCL (PF) 1 % IJ SOLN
INTRAMUSCULAR | Status: DC | PRN
  Administered 2022-05-15: 1 mL via SUBCUTANEOUS

## 2022-05-15 MED ORDER — CEFAZOLIN SODIUM-DEXTROSE 2-4 GM/100ML-% IV SOLN: 2.0000 g | Freq: Four times a day (QID) | INTRAVENOUS | Status: DC

## 2022-05-15 MED ORDER — CEFAZOLIN SODIUM-DEXTROSE 2-4 GM/100ML-% IV SOLN
INTRAVENOUS | Status: AC
  Administered 2022-05-15: 2 g via INTRAVENOUS
  Filled 2022-05-15: qty 100

## 2022-05-15 MED ORDER — PROPOFOL 10 MG/ML IV BOLUS
INTRAVENOUS | Status: AC
  Filled 2022-05-15: qty 20

## 2022-05-15 MED ORDER — BUPIVACAINE LIPOSOME 1.3 % IJ SUSP
INTRAMUSCULAR | Status: AC
  Filled 2022-05-15: qty 20

## 2022-05-15 MED ORDER — ONDANSETRON HCL 4 MG/2ML IJ SOLN
INTRAMUSCULAR | Status: DC | PRN
  Administered 2022-05-15: 4 mg via INTRAVENOUS

## 2022-05-15 MED ORDER — CHLORHEXIDINE GLUCONATE 0.12 % MT SOLN
OROMUCOSAL | Status: AC
  Administered 2022-05-15: 15 mL via OROMUCOSAL
  Filled 2022-05-15: qty 15

## 2022-05-15 MED ORDER — BUPIVACAINE-EPINEPHRINE (PF) 0.5% -1:200000 IJ SOLN: INTRAMUSCULAR | Status: DC | PRN

## 2022-05-15 MED ORDER — ONDANSETRON HCL 4 MG/2ML IJ SOLN
INTRAMUSCULAR | Status: AC
  Filled 2022-05-15: qty 2

## 2022-05-15 MED ORDER — CEFAZOLIN SODIUM-DEXTROSE 2-4 GM/100ML-% IV SOLN
2.0000 g | INTRAVENOUS | Status: AC
  Administered 2022-05-15: 2 g via INTRAVENOUS

## 2022-05-15 MED ORDER — PROMETHAZINE HCL 25 MG/ML IJ SOLN: 6.2500 mg | INTRAMUSCULAR | Status: DC | PRN

## 2022-05-15 MED ORDER — SUGAMMADEX SODIUM 200 MG/2ML IV SOLN
INTRAVENOUS | Status: DC | PRN
  Administered 2022-05-15: 150 mg via INTRAVENOUS

## 2022-05-15 MED ORDER — PHENYLEPHRINE HCL (PRESSORS) 10 MG/ML IV SOLN
INTRAVENOUS | Status: DC | PRN
  Administered 2022-05-15: 80 ug via INTRAVENOUS

## 2022-05-15 MED ORDER — OXYCODONE HCL 5 MG PO TABS: 5.0000 mg | ORAL_TABLET | ORAL | 0 refills | Status: AC | PRN

## 2022-05-15 MED ORDER — OXYCODONE HCL 5 MG/5ML PO SOLN: 5.0000 mg | Freq: Once | ORAL | Status: DC | PRN

## 2022-05-15 MED ORDER — BUPIVACAINE-EPINEPHRINE (PF) 0.5% -1:200000 IJ SOLN
INTRAMUSCULAR | Status: DC | PRN
  Administered 2022-05-15: 30 mL

## 2022-05-15 MED ORDER — KETOROLAC TROMETHAMINE 15 MG/ML IJ SOLN: 15.0000 mg | Freq: Once | INTRAMUSCULAR | Status: AC

## 2022-05-15 MED ORDER — KETOROLAC TROMETHAMINE 15 MG/ML IJ SOLN
INTRAMUSCULAR | Status: AC
  Administered 2022-05-15: 15 mg via INTRAVENOUS
  Filled 2022-05-15: qty 1

## 2022-05-15 MED ORDER — LIDOCAINE HCL (CARDIAC) PF 100 MG/5ML IV SOSY
PREFILLED_SYRINGE | INTRAVENOUS | Status: DC | PRN
  Administered 2022-05-15: 80 mg via INTRAVENOUS

## 2022-05-15 MED ORDER — DEXAMETHASONE SODIUM PHOSPHATE 10 MG/ML IJ SOLN
INTRAMUSCULAR | Status: AC
  Filled 2022-05-15: qty 1

## 2022-05-15 MED ORDER — OXYCODONE HCL 5 MG PO TABS: 5.0000 mg | ORAL_TABLET | Freq: Once | ORAL | Status: DC | PRN

## 2022-05-15 MED ORDER — LIDOCAINE HCL (PF) 2 % IJ SOLN
INTRAMUSCULAR | Status: AC
  Filled 2022-05-15: qty 5

## 2022-05-15 MED ORDER — FENTANYL CITRATE (PF) 100 MCG/2ML IJ SOLN
INTRAMUSCULAR | Status: DC | PRN
  Administered 2022-05-15: 25 ug via INTRAVENOUS
  Administered 2022-05-15: 50 ug via INTRAVENOUS
  Administered 2022-05-15: 25 ug via INTRAVENOUS

## 2022-05-15 MED ORDER — FENTANYL CITRATE PF 50 MCG/ML IJ SOSY
PREFILLED_SYRINGE | INTRAMUSCULAR | Status: AC
  Administered 2022-05-15: 50 ug via INTRAVENOUS
  Filled 2022-05-15: qty 1

## 2022-05-15 MED ORDER — SUCCINYLCHOLINE CHLORIDE 200 MG/10ML IV SOSY
PREFILLED_SYRINGE | INTRAVENOUS | Status: AC
  Filled 2022-05-15: qty 10

## 2022-05-15 MED ORDER — TRANEXAMIC ACID 1000 MG/10ML IV SOLN
INTRAVENOUS | Status: AC
  Filled 2022-05-15: qty 10

## 2022-05-15 MED ORDER — PROPOFOL 10 MG/ML IV BOLUS
INTRAVENOUS | Status: DC | PRN
  Administered 2022-05-15: 80 mg via INTRAVENOUS

## 2022-05-15 MED ORDER — LIDOCAINE HCL (PF) 1 % IJ SOLN
INTRAMUSCULAR | Status: AC
  Filled 2022-05-15: qty 5

## 2022-05-15 MED ORDER — BUPIVACAINE LIPOSOME 1.3 % IJ SUSP
INTRAMUSCULAR | Status: AC
  Filled 2022-05-15: qty 10

## 2022-05-15 MED ORDER — CEFAZOLIN SODIUM-DEXTROSE 2-4 GM/100ML-% IV SOLN
INTRAVENOUS | Status: AC
  Filled 2022-05-15: qty 100

## 2022-05-15 MED ORDER — 0.9 % SODIUM CHLORIDE (POUR BTL) OPTIME
TOPICAL | Status: DC | PRN
  Administered 2022-05-15: 500 mL

## 2022-05-15 MED ORDER — METOCLOPRAMIDE HCL 10 MG PO TABS: 5.0000 mg | ORAL_TABLET | Freq: Three times a day (TID) | ORAL | Status: DC | PRN

## 2022-05-15 MED ORDER — FENTANYL CITRATE (PF) 100 MCG/2ML IJ SOLN: 25.0000 ug | INTRAMUSCULAR | Status: DC | PRN

## 2022-05-15 MED ORDER — CHLORHEXIDINE GLUCONATE 0.12 % MT SOLN: 15.0000 mL | Freq: Once | OROMUCOSAL | Status: AC

## 2022-05-15 MED ORDER — BUPIVACAINE-EPINEPHRINE (PF) 0.5% -1:200000 IJ SOLN
INTRAMUSCULAR | Status: AC
  Filled 2022-05-15: qty 30

## 2022-05-15 MED ORDER — ORAL CARE MOUTH RINSE: 15.0000 mL | Freq: Once | OROMUCOSAL | Status: AC

## 2022-05-15 MED ORDER — SODIUM CHLORIDE 0.9 % IV SOLN: INTRAVENOUS | Status: DC

## 2022-05-15 MED ORDER — FENTANYL CITRATE (PF) 100 MCG/2ML IJ SOLN
INTRAMUSCULAR | Status: AC
  Filled 2022-05-15: qty 2

## 2022-05-15 MED ORDER — SUCCINYLCHOLINE CHLORIDE 200 MG/10ML IV SOSY
PREFILLED_SYRINGE | INTRAVENOUS | Status: DC | PRN
  Administered 2022-05-15: 80 mg via INTRAVENOUS

## 2022-05-15 MED ORDER — ONDANSETRON HCL 4 MG/2ML IJ SOLN: 4.0000 mg | Freq: Four times a day (QID) | INTRAMUSCULAR | Status: DC | PRN

## 2022-05-15 MED ORDER — OXYCODONE HCL 5 MG PO TABS: 5.0000 mg | ORAL_TABLET | ORAL | Status: DC | PRN

## 2022-05-15 MED ORDER — ACETAMINOPHEN 10 MG/ML IV SOLN: 1000.0000 mg | Freq: Once | INTRAVENOUS | Status: DC | PRN

## 2022-05-15 MED ORDER — BUPIVACAINE HCL (PF) 0.5 % IJ SOLN
INTRAMUSCULAR | Status: AC
  Filled 2022-05-15: qty 10

## 2022-05-15 MED ORDER — BUPIVACAINE HCL (PF) 0.5 % IJ SOLN
INTRAMUSCULAR | Status: DC | PRN
  Administered 2022-05-15: 10 mL via PERINEURAL

## 2022-05-15 MED ORDER — DEXAMETHASONE SODIUM PHOSPHATE 10 MG/ML IJ SOLN
INTRAMUSCULAR | Status: DC | PRN
  Administered 2022-05-15: 5 mg via INTRAVENOUS

## 2022-05-15 MED ORDER — SODIUM CHLORIDE 0.9 % IR SOLN
Status: DC | PRN
  Administered 2022-05-15: 3000 mL

## 2022-05-15 MED ORDER — EPHEDRINE SULFATE (PRESSORS) 50 MG/ML IJ SOLN
INTRAMUSCULAR | Status: DC | PRN
  Administered 2022-05-15: 5 mg via INTRAVENOUS

## 2022-05-15 MED ORDER — LACTATED RINGERS IV SOLN: INTRAVENOUS | Status: DC

## 2022-05-15 MED ORDER — METOCLOPRAMIDE HCL 5 MG/ML IJ SOLN: 5.0000 mg | Freq: Three times a day (TID) | INTRAMUSCULAR | Status: DC | PRN

## 2022-05-15 MED ORDER — ONDANSETRON HCL 4 MG PO TABS: 4.0000 mg | ORAL_TABLET | Freq: Four times a day (QID) | ORAL | Status: DC | PRN

## 2022-05-15 MED ORDER — BUPIVACAINE LIPOSOME 1.3 % IJ SUSP
INTRAMUSCULAR | Status: DC | PRN
  Administered 2022-05-15: 10 mL via PERINEURAL

## 2022-05-15 SURGICAL SUPPLY — 69 items
BASEPLATE BOSS DRILL (MISCELLANEOUS) IMPLANT
BIT DRILL 2.5 (BIT) ×1
BIT DRILL 2.5X4.5XSCR (BIT) IMPLANT
BIT DRILL F/BASEPLATE CENTRAL (BIT) IMPLANT
BIT DRL 2.5X4.5XSCR (BIT) ×1
BLADE SAW SAG 25X90X1.19 (BLADE) ×1 IMPLANT
CHLORAPREP W/TINT 26 (MISCELLANEOUS) ×1 IMPLANT
COOLER POLAR GLACIER W/PUMP (MISCELLANEOUS) ×1 IMPLANT
COVER BACK TABLE REUSABLE LG (DRAPES) ×1 IMPLANT
DRAPE 3/4 80X56 (DRAPES) ×1 IMPLANT
DRAPE INCISE IOBAN 66X45 STRL (DRAPES) ×1 IMPLANT
DRILL BASEPLATE CENTRAL  S (BIT) ×1
DRILL BASEPLATE CENTRAL S (BIT) ×1
DRSG OPSITE POSTOP 4X8 (GAUZE/BANDAGES/DRESSINGS) ×1 IMPLANT
ELECT BLADE 6.5 EXT (BLADE) IMPLANT
ELECT CAUTERY BLADE 6.4 (BLADE) ×1 IMPLANT
ELECT REM PT RETURN 9FT ADLT (ELECTROSURGICAL) ×1
ELECTRODE REM PT RTRN 9FT ADLT (ELECTROSURGICAL) ×1 IMPLANT
GAUZE XEROFORM 1X8 LF (GAUZE/BANDAGES/DRESSINGS) ×1 IMPLANT
GLENOSPHERE RSS 2 CONCENTRIC (Shoulder) IMPLANT
GLOVE BIO SURGEON STRL SZ7.5 (GLOVE) ×4 IMPLANT
GLOVE BIO SURGEON STRL SZ8 (GLOVE) ×4 IMPLANT
GLOVE BIOGEL PI IND STRL 8 (GLOVE) ×2 IMPLANT
GLOVE SURG UNDER LTX SZ8 (GLOVE) ×1 IMPLANT
GOWN STRL REUS W/ TWL LRG LVL3 (GOWN DISPOSABLE) ×1 IMPLANT
GOWN STRL REUS W/ TWL XL LVL3 (GOWN DISPOSABLE) ×1 IMPLANT
GOWN STRL REUS W/TWL LRG LVL3 (GOWN DISPOSABLE) ×1
GOWN STRL REUS W/TWL XL LVL3 (GOWN DISPOSABLE) ×1
GUIDE PIN 2.0 X 150 (WIRE) IMPLANT
HOOD PEEL AWAY FLYTE STAYCOOL (MISCELLANEOUS) ×3 IMPLANT
IV NS IRRIG 3000ML ARTHROMATIC (IV SOLUTION) ×1 IMPLANT
KIT STABILIZATION SHOULDER (MISCELLANEOUS) ×1 IMPLANT
KIT TURNOVER KIT A (KITS) ×1 IMPLANT
LINER STD +6S RSS HXL (Liner) IMPLANT
MANIFOLD NEPTUNE II (INSTRUMENTS) ×1 IMPLANT
MASK FACE SPIDER DISP (MASK) ×1 IMPLANT
MAT ABSORB  FLUID 56X50 GRAY (MISCELLANEOUS) ×1
MAT ABSORB FLUID 56X50 GRAY (MISCELLANEOUS) ×1 IMPLANT
NDL MAYO CATGUT SZ1 (NEEDLE) IMPLANT
NDL SAFETY ECLIP 18X1.5 (MISCELLANEOUS) ×1 IMPLANT
NDL SPNL 20GX3.5 QUINCKE YW (NEEDLE) ×1 IMPLANT
NEEDLE MAYO CATGUT SZ1 (NEEDLE) IMPLANT
NEEDLE SPNL 20GX3.5 QUINCKE YW (NEEDLE) ×1 IMPLANT
NS IRRIG 500ML POUR BTL (IV SOLUTION) ×1 IMPLANT
PACK ARTHROSCOPY SHOULDER (MISCELLANEOUS) ×1 IMPLANT
PAD ARMBOARD 7.5X6 YLW CONV (MISCELLANEOUS) ×1 IMPLANT
PAD WRAPON POLAR SHDR UNIV (MISCELLANEOUS) ×1 IMPLANT
PLATE BASE REVERSE RSS S (Plate) IMPLANT
PULSAVAC PLUS IRRIG FAN TIP (DISPOSABLE) ×1
SCREW 4.5X15 RSS W CAP (Screw) IMPLANT
SCREW 4.5X20 RSS W CAP (Screw) IMPLANT
SCREW BODY REVERSE STD (Screw) IMPLANT
SLING ULTRA II M (MISCELLANEOUS) IMPLANT
SPONGE T-LAP 18X18 ~~LOC~~+RFID (SPONGE) ×2 IMPLANT
STAPLER SKIN PROX 35W (STAPLE) ×1 IMPLANT
STEM PRES FIT SZ 12 TSS (Stem) IMPLANT
SUT ETHIBOND 0 MO6 C/R (SUTURE) ×1 IMPLANT
SUT FIBERWIRE #2 38 BLUE 1/2 (SUTURE) ×3
SUT VIC AB 0 CT1 36 (SUTURE) ×1 IMPLANT
SUT VIC AB 2-0 CT1 27 (SUTURE) ×2
SUT VIC AB 2-0 CT1 TAPERPNT 27 (SUTURE) ×2 IMPLANT
SUTURE FIBERWR #2 38 BLUE 1/2 (SUTURE) ×4 IMPLANT
SYR 10ML LL (SYRINGE) ×1 IMPLANT
SYR 30ML LL (SYRINGE) ×1 IMPLANT
SYR TOOMEY 50ML (SYRINGE) ×1 IMPLANT
TIP FAN IRRIG PULSAVAC PLUS (DISPOSABLE) ×1 IMPLANT
TRAP FLUID SMOKE EVACUATOR (MISCELLANEOUS) ×1 IMPLANT
WATER STERILE IRR 500ML POUR (IV SOLUTION) ×1 IMPLANT
WRAPON POLAR PAD SHDR UNIV (MISCELLANEOUS) ×1

## 2022-05-15 NOTE — Anesthesia Procedure Notes (Signed)
Procedure Name: Intubation Date/Time: 05/15/2022 10:45 AM  Performed by: Fredderick Phenix, CRNAPre-anesthesia Checklist: Patient identified, Emergency Drugs available, Suction available and Patient being monitored Patient Re-evaluated:Patient Re-evaluated prior to induction Oxygen Delivery Method: Circle system utilized Preoxygenation: Pre-oxygenation with 100% oxygen Induction Type: IV induction Ventilation: Mask ventilation without difficulty Grade View: Grade I Tube type: Oral Tube size: 7.0 mm Number of attempts: 1 Airway Equipment and Method: Stylet and Oral airway Placement Confirmation: ETT inserted through vocal cords under direct vision, positive ETCO2 and breath sounds checked- equal and bilateral Secured at: 21 cm Tube secured with: Tape Dental Injury: Teeth and Oropharynx as per pre-operative assessment

## 2022-05-15 NOTE — Discharge Instructions (Addendum)
Orthopedic discharge instructions: May shower with intact OpSite dressing after nerve block has worn off on post-op day #4 (Monday).  Apply ice frequently to shoulder. Take Mobic 15 mg daily OR ibuprofen 600 mg TID with meals for 5-7 days, then as necessary. Take oxycodone as prescribed when needed.  May supplement with ES Tylenol if necessary. Keep shoulder immobilizer on at all times except may remove for bathing purposes. Follow-up in 10-14 days or as scheduled.   AMBULATORY SURGERY  DISCHARGE INSTRUCTIONS   The drugs that you were given will stay in your system until tomorrow so for the next 24 hours you should not:  Drive an automobile Make any legal decisions Drink any alcoholic beverage   You may resume regular meals tomorrow.  Today it is better to start with liquids and gradually work up to solid foods.  You may eat anything you prefer, but it is better to start with liquids, then soup and crackers, and gradually work up to solid foods.   Please notify your doctor immediately if you have any unusual bleeding, trouble breathing, redness and pain at the surgery site, drainage, fever, or pain not relieved by medication.    Additional Instructions:        Please contact your physician with any problems or Same Day Surgery at [phone], Monday through Friday 6 am to 4 pm, or [place] [facility] number at [phone].   POLAR CARE INFORMATION  [email or web address]  How to use [place] Cold Therapy System?  YouTube   [email or web address]  OPERATING INSTRUCTIONS  Start the product With dry hands, connect the transformer to the electrical connection located on the top of the cooler. Next, plug the transformer into an appropriate electrical outlet. The unit will automatically start running at this point.  To stop the pump, disconnect electrical power.  Unplug to stop the product when not in use. Unplugging the Polar Care  unit turns it off. Always unplug immediately after use. Never leave it plugged in while unattended. Remove pad.    FIRST ADD WATER TO FILL LINE, THEN ICE---Replace ice when existing ice is almost melted  1 Discuss Treatment with your [place] Practitioner and Use Only as Prescribed 2 Apply Insulation Barrier & Cold Therapy Pad 3 Check for Moisture 4 Inspect Skin Regularly  Tips and Trouble Shooting Usage Tips 1. Use cubed or chunked ice for optimal performance. 2. It is recommended to drain the Pad between uses. To drain the pad, hold the Pad upright with the hose pointed toward the ground. Depress the black plunger and allow water to drain out. 3. You may disconnect the Pad from the unit without removing the pad from the affected area by depressing the silver tabs on the hose coupling and gently pulling the hoses apart. The Pad and unit will seal itself and will not leak. Note: Some dripping during release is normal. 4. DO NOT RUN PUMP WITHOUT WATER! The pump in this unit is designed to run with water. Running the unit without water will cause permanent damage to the pump. 5. Unplug unit before removing lid.  TROUBLESHOOTING GUIDE Pump not running, Water not flowing to the pad, Pad is not getting cold 1. Make sure the transformer is plugged into the wall outlet. 2. Confirm that the ice and water are filled to the indicated levels. 3. Make sure there are no kinks in the pad. 4. Gently pull on the blue tube to make sure the tube/pad junction is  straight. 5. Remove the pad from the treatment site and ll it while the pad is lying at; then reapply. 6. Confirm that the pad couplings are securely attached to the unit. Listen for the double clicks (Figure 1) to confirm the pad couplings are securely attached.  Leaks    Note: Some condensation on the lines, controller, and pads is unavoidable, especially in warmer climates. 1. If using a [place] Polar Care Cold Therapy unit with a  detachable Cold Therapy Pad, and a leak exists (other than condensation on the lines) disconnect the pad couplings. Make sure the silver tabs on the couplings are depressed before reconnecting the pad to the pump hose; then confirm both sides of the coupling are properly clicked in. 2. If the coupling continues to leak or a leak is detected in the pad itself, stop using it and call [place] at [phone].  Cleaning After use, empty and dry the unit with a soft cloth. Warm water and mild detergent may be used occasionally to clean the pump and tubes.  WARNING: The Reader can be cold enough to cause serious injury, including full skin necrosis. Follow these Operating Instructions, and carefully read the Product Insert (see pouch on side of unit) and the Cold Therapy Pad Fitting Instructions (provided with each Cold Therapy Pad) prior to use.       SHOULDER SLING IMMOBILIZER   VIDEO Slingshot 2 Shoulder Brace Application - YouTube ---[email or web address]  INSTRUCTIONS While supporting the injured arm, slide the forearm into the sling. Wrap the adjustable shoulder strap around the neck and shoulders and attach the strap end to the sling using  the "alligator strap tab."  Adjust the shoulder strap to the required length. Position the shoulder pad behind the neck. To secure the shoulder pad location (optional), pull the shoulder strap away from the shoulder pad, unfold the hook material on the top of the pad, then press the shoulder strap back onto the hook material to secure the pad in place. Attach the closure strap across the open top of the sling. Position the strap so that it holds the arm securely in the sling. Next, attach the thumb strap to the open end of the sling between the thumb and fingers. After sling has been fit, it may be easily removed and reapplied using the quick release buckle on shoulder strap. If a neutral pillow or 15 abduction  pillow is included, place the pillow at the waistline. Attach the sling to the pillow, lining up hook material on the pillow with the loop on sling. Adjust the waist strap to fit.  If waist strap is too long, cut it to fit. Use the small piece of double sided hook material (located on top of the pillow) to secure the strap end. Place the double sided hook material on the inside of the cut strap end and secure it to the waist strap.     If no pillow is included, attach the waist strap to the sling and adjust to fit.    Washing Instructions: Straps and sling must be removed and cleaned regularly depending on your activity level and perspiration. Hand wash straps and sling in cold water with mild detergent, rinse, air dry   Interscalene Nerve Block (ISNB) Discharge Instructions    For your surgery you have received an Interscalene Nerve Block. Nerve Blocks affect many types of nerves, including nerves that control movement, pain and normal sensation.  You may experience feelings such as  numbness, tingling, heaviness, weakness or the inability to move your arm or the feeling or sensation that your arm has "fallen asleep". A nerve block can last for 2 - 36 hours or more depending on the medication used.  Usually the weakness wears off first.  The tingling and heaviness usually wear off next.  Finally you may start to notice pain.  Keep in mind that this may occur in any order.  once a nerve block starts to wear off it is usually completely gone within 60 minutes. ISNB may cause mild shortness of breath, a hoarse voice, blurry vision, unequal pupils, or drooping of the face on the same side as the nerve block.  These symptoms will usually go away within 12 hours.  Very rarely the procedure itself can cause mild seizures. If needed, your surgeon will give you a prescription for pain medication.  It will take about 60 minutes for the oral pain medication to become fully effective.  So, it is recommended that  you start taking this medication before the nerve block first begins to wear off, or when you first begin to feel discomfort. Keep in mind that nerve blocks often wear off in the middle of the night.   If you are going to bed and the block has not started to wear off or you have not started to have any discomfort, consider setting an alarm for 2 to 3 hours, so you can assess your block.  If you notice the block is wearing off or you are starting to have discomfort, you can take your pain medication. Take your pain medication only as prescribed.  Pain medication can cause sedation and decrease your breathing if you take more than you need for the level of pain that you have. Nausea is a common side effect of many pain medications.  You may want to eat something before taking your pain medicine to prevent nausea. After an Interscalene nerve block, you cannot feel pain, pressure or extremes in temperature in the effected arm.  Because your arm is numb it is at an increased risk for injury.  To decrease the possibility of injury, please practice the following:  While you are awake change the position of your arm frequently to prevent too much pressure on any one area for prolonged periods of time.  If you have a cast or tight dressing, check the color or your fingers every couple of hours.  Call your surgeon with the appearance of any discoloration (white or blue). If you are given a sling to wear before you go home, please wear it  at all times until the block has completely worn off.  Do not get up at night without your sling. If you experience any problems or concerns, please contact your surgeon's office. If you experience severe or prolonged shortness of breath go to the nearest emergency department.

## 2022-05-15 NOTE — Anesthesia Postprocedure Evaluation (Signed)
Anesthesia Post Note  Patient: Sara Hendrix  Procedure(s) Performed: REVERSE SHOULDER ARTHROPLASTY WITH BICEPS TENODESIS. (Left: Shoulder)  Patient location during evaluation: PACU Anesthesia Type: General Level of consciousness: awake and alert Pain management: pain level controlled Vital Signs Assessment: post-procedure vital signs reviewed and stable Respiratory status: spontaneous breathing, nonlabored ventilation and respiratory function stable Cardiovascular status: blood pressure returned to baseline and stable Postop Assessment: no apparent nausea or vomiting Anesthetic complications: no   No notable events documented.   Last Vitals:  Vitals:   05/15/22 1400 05/15/22 1415  BP: (!) 131/59 (!) 133/52  Pulse: 80 76  Resp: 20 18  Temp: 36.5 C 36.4 C  SpO2: 96% 96%    Last Pain:  Vitals:   05/15/22 1415  TempSrc: Tympanic  PainSc: 0-No pain                 [place] Ouch

## 2022-05-15 NOTE — Anesthesia Preprocedure Evaluation (Addendum)
Anesthesia Evaluation  Patient identified by MRN, date of birth, ID band Patient awake    Reviewed: Allergy & Precautions, NPO status , Patient's Chart, lab work & pertinent test results  Airway Mallampati: III  TM Distance: >3 FB Neck ROM: full    Dental  (+) Edentulous Upper, Edentulous Lower   Pulmonary neg pulmonary ROS,    Pulmonary exam normal        Cardiovascular Exercise Tolerance: Good hypertension, Pt. on medications Normal cardiovascular exam     Neuro/Psych negative neurological ROS  negative psych ROS   GI/Hepatic Neg liver ROS, GERD  Medicated and Controlled,  Endo/Other  Pre-diabetes  Renal/GU      Musculoskeletal  (+) Arthritis ,   Abdominal Normal abdominal exam  (+)   Peds  Hematology negative hematology ROS (+)   Anesthesia Other Findings Past Medical History: No date: Arthritis No date: GERD (gastroesophageal reflux disease) No date: Hypertension No date: Pre-diabetes  Past Surgical History: No date: EYE SURGERY     Comment:  rt eye cataract 06/25/2020: HARDWARE REMOVAL; Right     Comment:  Procedure: HARDWARE REMOVAL;  Surgeon: Dereck Leep,               MD;  Location: [facility] ORS;  Service: Orthopedics;                Laterality: Right; 07/27/2019: ORIF ANKLE FRACTURE; Right     Comment:  Procedure: OPEN REDUCTION INTERNAL FIXATION (ORIF) ANKLE              FRACTURE, TRIMALLEOLAR;  Surgeon: Dereck Leep, MD;                Location: [facility] ORS;  Service: Orthopedics;  Laterality:               Right;     Reproductive/Obstetrics negative OB ROS                           Anesthesia Physical Anesthesia Plan  ASA: 2  Anesthesia Plan: General ETT   Post-op Pain Management: Regional block* and Minimal or no pain anticipated   Induction: Intravenous  PONV Risk Score and Plan: 3 and Ondansetron, Dexamethasone and Treatment may vary due to age or medical  condition  Airway Management Planned: Oral ETT  Additional Equipment:   Intra-op Plan:   Post-operative Plan: Extubation in OR  Informed Consent: I have reviewed the patients History and Physical, chart, labs and discussed the procedure including the risks, benefits and alternatives for the proposed anesthesia with the patient or authorized representative who has indicated his/her understanding and acceptance.     Dental Advisory Given and Dental advisory given  Plan Discussed with: Anesthesiologist, CRNA and Surgeon  Anesthesia Plan Comments:        Anesthesia Quick Evaluation

## 2022-05-15 NOTE — H&P (Signed)
History of Present Illness: Sara Hendrix is a 81 y.o. who presents today for a history and physical. She is to undergo a reverse left total shoulder arthroplasty on 05/15/2022. Patient has continued to have discomfort. She states the pain is to the point it is interfering with her activities of daily living and wishes to proceed with surgery.  The patient's symptoms began several years ago and developed as a result of an injury. Apparently she lost her balance and fell, injuring her ankle. During this fall, she also fell against a cement wall, injuring her left shoulder. She had a trimalleolar ankle fracture so this was addressed first, but her shoulder has continued to cause her discomfort. Over the past few months or so, her symptoms have worsened, prompting her to seek orthopedic care. However, the patient also was referred to me for discussion of further treatment options. The patient describes the symptoms as moderate (patient is active but has had to make modifications or give up activities) and have the quality of being aching, nagging, stabbing, tender and throbbing. The pain is localized to the lateral arm/shoulder and localized to the anterior shoulder. These symptoms are aggravated with normal daily activities, with sleeping, carrying heavy objects, at higher levels of activity, with overhead activity, reaching behind the back and activity in general. She has tried acetaminophen and non-steroidal anti-inflammatories (Advil and Mobic) with limited benefit. She has tried rest with no significant benefit. She has tried the one injection described above with temporary partial relief. She denies any neck pain, nor does she note any numbness or paresthesias down her arm to her hand. She does recall sustaining a left proximal humerus fracture over 30 years ago which was treated nonsurgically.   Past Medical History: GERD (gastroesophageal reflux disease)  Hyperlipidemia  Hypertension   Past Surgical  History: Open reduction and internal fixation of the right bimalleolar ankle fracture 07/27/2019 (Dr Marry Guan)  Removal of retained hardware from the right ankle 06/25/2020 (Dr Marry Guan)   Past Family History: Stroke Mother  Diabetes type II Sister   Medications: acetaminophen (TYLENOL) 500 MG tablet Take 500 mg by mouth 2 (two) times daily as needed for Pain  aspirin 81 MG EC tablet Take 81 mg by mouth once daily  atorvastatin (LIPITOR) 20 MG tablet Take 20 mg by mouth once daily  lisinopriL (ZESTRIL) 10 MG tablet Take 20 mg by mouth once daily  LORazepam (ATIVAN) 0.5 MG tablet Take 1 tablet 4 hours prior to MRI scan. May repeat 1/2-hour prior to MRI scan. 2 tablet 0  magnesium oxide (MAG-OX) 400 mg (241.3 mg magnesium) tablet Take 400 mg by mouth once daily  meloxicam (MOBIC) 15 MG tablet Take 15 mg by mouth once daily  omeprazole (PRILOSEC) 20 MG DR capsule Take 20 mg by mouth once daily  prednisoLONE acetate (PRED FORTE) 1 % ophthalmic suspension Administer 1 drop to the right eye four (4) times a day. SHAKE FOR 1 MINUTE  traZODone (DESYREL) 50 MG tablet Take 50 mg by mouth once daily  meclizine (ANTIVERT) 25 mg tablet Take 25 mg by mouth 3 (three) times daily as needed (Patient not taking: Reported on 05/06/2022)  ondansetron (ZOFRAN) 4 MG tablet Take 4 mg by mouth once as needed (Patient not taking: Reported on 05/06/2022)   Allergies: Codeine Hallucination   Review of Systems A comprehensive 14 point ROS was performed, reviewed, and the pertinent orthopaedic findings are documented in the HPI.  Physical Exam: BP 126/84  Ht 167.6 cm ('5\' 6"'$ )  Wt 72.8 kg (160 lb 6.4 oz)  BMI 25.89 kg/m   General: Well-developed well-nourished female seen in no acute distress.   HEENT: Atraumatic,normocephalic. Pupils are equal and reactive to light. Oropharynx is clear with moist mucosa  Lungs: Clear to auscultation bilaterally   Cardiovascular: Regular rate and rhythm. Normal S1, S2. No  murmurs. No appreciable gallops or rubs. Peripheral pulses are palpable.  Abdomen: Soft, non-tender, nondistended. Bowel sounds present  Left shoulder exam: SKIN: normal SWELLING: none WARMTH: none LYMPH NODES: no adenopathy palpable CREPITUS: Mild glenohumeral crepitance TENDERNESS: Mildly tender over anterolateral shoulder ROM (active):  Forward flexion: 135 degrees Abduction: 130 degrees Internal rotation: L4 ROM (passive):  Forward flexion: 145 degrees Abduction: 135 degrees  ER/IR at 90 abd: 70 degrees / 50 degrees  She has mild to moderate pain and crepitance with all motions.  STRENGTH: Forward flexion: 4/5 Abduction: 4/5 External rotation: 4/5 Internal rotation: 4/5 Pain with RC testing: She has mild to moderate pain with resisted forward flexion and abduction  STABILITY: Normal  SPECIAL TESTS: Luan Pulling' test: positive, mild Speed's test: Not evaluated Capsulitis - pain w/ passive ER: no Crossed arm test: Mildly positive Crank: Not evaluated Anterior apprehension: Negative Posterior apprehension: Not evaluated  Neurological: The patient is alert and oriented Sensation to light touch appears to be intact and within normal limits Gross motor strength appeared to be equal to 5/5  Vascular : Peripheral pulses felt to be palpable. Capillary refill appears to be intact and within normal limits  X-ray: X-rays taken in [place] clinic demonstrate significant degenerative changes with complete loss of the glenohumeral joint space. The subacromial space is mildly decreased. There is no subacromial or infra-clavicular spurring. She demonstrates a Type II acromion.   Impression: 1. Degenerative arthrosis, left shoulder.  Plan:  The treatment options were discussed with the patient. In addition, patient educational materials were provided regarding the diagnosis and treatment options. The patient is quite frustrated by her symptoms and functional limitations, and is  ready to consider more aggressive treatment options. Therefore, I have recommended a surgical procedure, specifically a reverse left total shoulder arthroplasty. The procedure was discussed with the patient, as were the potential risks (including bleeding, infection, nerve and/or blood vessel injury, persistent or recurrent pain, loosening and/or failure of the components, dislocation, need for further surgery, blood clots, strokes, heart attacks and/or arhythmias, pneumonia, etc.) and benefits. The patient states her understanding and wishes to proceed. All of the patient's questions and concerns were answered. She can call any time with further concerns. She will follow up post-surgery, routine.     H&P reviewed and patient re-examined. No changes.

## 2022-05-15 NOTE — Evaluation (Signed)
Occupational Therapy Evaluation Patient Details Name: Sara Hendrix MRN: [id number] DOB: July 16, 1941 Today's Date: 05/15/2022   History of Present Illness Pt seen for L reverse total shoulder. Had injury a few years ago (with ankle surgeries from that injury as well). 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   Clinical Impression   Patient was seen for an OT evaluation this date. Pt lives in one level home, 1 step to enter, with husband; daughter [place] the [place] side [place] the [place]. Prior to surgery, pt was active and independent. Pt has orders for LUE to be immobilized and will be NWBing per MD. Patient presents with impaired strength/ROM, pain, and sensation to LUE with block not completely resolved yet. These impairments result in a decreased ability to perform self care tasks requiring MOD assist for UB/LB dressing and bathing and MAX assist for application of polar care, compression stockings, and sling/immobilizer. Pt instructed in polar care mgt, sling/immobilizer mgt,  instructions for no shoulder exercises until full sensation has returned and to follow up with next OT after d/c about shoulder exercises, LUE precautions, adaptive strategies for bathing/dressing/toileting/grooming, positioning and considerations for sleep, and home/routines modifications to maximize falls prevention, safety, and independence. Handout provided. Husband demonstrated return teaching for donning shirt, sling, and polar care. OT adjusted sling/immobilizer and polar care to improve comfort, optimize positioning, and to maximize skin integrity/safety. Pt verbalized understanding of all education/training provided. Pt will benefit from skilled OT services to address these limitations and improve independence in daily tasks once d/c from hospital; follow recommendation of MD for [place] vs outpatient.  Ready to d/c home with family support.         Recommendations for follow up  therapy are one component of a multi-disciplinary discharge planning process, led by the attending physician.  Recommendations may be updated based on patient status, additional functional criteria and insurance authorization.   Follow Up Recommendations  Follow physician's recommendations for discharge plan and follow up therapies    Assistance Recommended at Discharge Intermittent Supervision/Assistance  Patient can return home with the following A lot of help with bathing/dressing/bathroom;Direct supervision/assist for medications management;Direct supervision/assist for financial management;Assistance with cooking/housework;Assist for transportation    Functional Status Assessment  Patient has had a recent decline in their functional status and demonstrates the ability to make significant improvements in function in a reasonable and predictable amount of time.  Equipment Recommendations  None recommended by OT    Recommendations for Other Services       Precautions / Restrictions Precautions Precautions: Shoulder Type of Shoulder Precautions: no active shoulder ROM; keep shoulder in sling at all times. Shoulder Interventions: Shoulder sling/immobilizer;Shoulder abduction pillow;At all times;Off for dressing/bathing/exercises Precaution Booklet Issued: No (Handout for ADLs provided) Precaution Comments: Husband and daughter present for teaching of precautions as well. Required Braces or Orthoses: Sling Restrictions Weight Bearing Restrictions: Yes (LUE) LUE Weight Bearing: Non weight bearing Other Position/Activity Restrictions: No active ROM L shoulder      Mobility Bed Mobility                    Transfers Overall transfer level: Independent Equipment used: None               General transfer comment: sit to stand from recliner chair      Balance Overall balance assessment: Independent  ADL  either performed or assessed with clinical judgement   ADL Overall ADL's : Needs assistance/impaired Eating/Feeding: Set up Eating/Feeding Details (indicate cue type and reason): with RUE only Grooming: Set up (with RUE only)   Upper Body Bathing: Minimal assistance (assist for holding LUE while sponge bathing L axillary area with pt's R hand)   Lower Body Bathing: Set up;Sitting/lateral leans   Upper Body Dressing : Moderate assistance;Adhering to UE precautions;Sitting Upper Body Dressing Details (indicate cue type and reason): assist for donning polarcare and sling; husband demonstrating competence with extra time during session. Pt also acknowledging understanding Lower Body Dressing: Set up Lower Body Dressing Details (indicate cue type and reason): using RUE only Toilet Transfer: Set up   [place] and Hygiene: Set up (with RUE only)   Tub/ Shower Transfer:  (contraindicated right now; sponge bathe only)   Functional mobility during ADLs: Independent       Vision Baseline Vision/History: 1 Wears glasses Ability to See in Adequate Light: 0 Adequate Vision Assessment?:  (wears glasses; wearing them during evaluation)     Perception     Praxis      Pertinent Vitals/Pain Pain Assessment Pain Assessment: No/denies pain     Hand Dominance Right   Extremity/Trunk Assessment Upper Extremity Assessment Upper Extremity Assessment: LUE deficits/detail LUE Deficits / Details: s/p shoulder surgery LUE: Unable to fully assess due to immobilization   Lower Extremity Assessment Lower Extremity Assessment: Overall WFL for tasks assessed       Communication Communication Communication: No difficulties   Cognition Arousal/Alertness: Awake/alert Behavior During Therapy: WFL for tasks assessed/performed Overall Cognitive Status: Within Functional Limits for tasks assessed                                       General Comments   redness on back of L shoulder 2/2 towel being folded and polarice in direct contact with skin    Exercises     Shoulder Instructions      Home Living Family/patient expects to be discharged to:: Private residence Living Arrangements: Spouse/significant other Available Help at Discharge: Family (spouse 24/7; daughter intermittent) Type of Home: House Home Access: Stairs to enter Technical brewer of Steps: 1   Home Layout: One level         Bathroom Toilet: Standard     Home Equipment: None   Additional Comments: Pt not using AD for mobility at baseline unless outside; she used a walking stick.      Prior Functioning/Environment Prior Level of Function : Independent/Modified Independent             Mobility Comments: Used walking stick outside; otherwise, no AD ADLs Comments: (I) prior to admission; L shoulder pain.        OT Problem List:        OT Treatment/Interventions:      OT Goals(Current goals can be found in the care plan section) Acute Rehab OT Goals Patient Stated Goal: go home OT Goal Formulation: With patient/family  OT Frequency:      Co-evaluation              AM-PAC OT "6 Clicks" Daily Activity     Outcome Measure Help from another person eating meals?: None Help from another person taking care of personal grooming?: None Help from another person toileting, which includes using toliet, bedpan, or urinal?: None Help from another  person bathing (including washing, rinsing, drying)?: A Little Help from another person to put on and taking off regular upper body clothing?: A Lot Help from another person to put on and taking off regular lower body clothing?: None 6 Click Score: 21   End of Session    Activity Tolerance:  Good Patient left:  Seated in recliner; family in room.                    Time: 3428-7681 OT Time Calculation (min): 34 min Charges:  OT General Charges $OT Visit: 1 Visit OT Evaluation $OT Eval Low  Complexity: 1 Low OT Treatments $Self Care/Home Management : 8-22 mins  Waymon Amato, MS, OTR/L   Sara Hendrix 05/15/2022, 3:40 PM

## 2022-05-15 NOTE — Anesthesia Procedure Notes (Signed)
Anesthesia Regional Block: Interscalene brachial plexus block   Pre-Anesthetic Checklist: , timeout performed,  Correct Patient, Correct Site, Correct Laterality,  Correct Procedure, Correct Position, site marked,  Risks and benefits discussed,  Surgical consent,  Pre-op evaluation,  At surgeon's request and post-op pain management  Laterality: Upper and Left  Prep: chloraprep       Needles:  Injection technique: Single-shot  Needle Type: Stimiplex     Needle Length: 9cm  Needle Gauge: 22     Additional Needles:   Procedures:,,,, ultrasound used (permanent image in chart),,    Narrative:  Start time: 05/15/2022 9:50 AM End time: 05/15/2022 9:55 AM Injection made incrementally with aspirations every 5 mL.  Performed by: Personally  Anesthesiologist: Iran Ouch, MD  Additional Notes: Patient consented for risk and benefits of nerve block including but not limited to nerve damage, failed block, bleeding and infection.  Patient voiced understanding.  Functioning IV was confirmed and monitors were applied.  Timeout done prior to procedure and prior to any sedation being given to the patient.  Patient confirmed procedure site prior to any sedation given to the patient. Sterile prep,hand hygiene and sterile gloves were used.  Minimal sedation used for procedure.  No paresthesia endorsed by patient during the procedure.  Negative aspiration and negative test dose prior to incremental administration of local anesthetic. The patient tolerated the procedure well with no immediate complications.

## 2022-05-15 NOTE — Transfer of Care (Signed)
Immediate Anesthesia Transfer of Care Note  Patient: Sara Hendrix  Procedure(s) Performed: REVERSE SHOULDER ARTHROPLASTY WITH BICEPS TENODESIS. (Left: Shoulder)  Patient Location: PACU  Anesthesia Type:General  Level of Consciousness: drowsy  Airway & Oxygen Therapy: Patient Spontanous Breathing and Patient connected to face mask oxygen  Post-op Assessment: Report given to RN and Post -op Vital signs reviewed and stable  Post vital signs: Reviewed and stable  Last Vitals:  Vitals Value Taken Time  BP 157/59 05/15/22 1305  Temp    Pulse 89 05/15/22 1306  Resp 22 05/15/22 1306  SpO2 98 % 05/15/22 1306  Vitals shown include unvalidated device data.  Last Pain:  Vitals:   05/15/22 1000  TempSrc:   PainSc: Asleep         Complications: No notable events documented.

## 2022-05-15 NOTE — Op Note (Signed)
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  Post-Op Diagnosis:   Same.  Procedure:   Reverse left total shoulder arthroplasty with biceps tenodesis.  Surgeon:   Pascal Lux, MD  Assistant:   Cameron Proud, PA-C  Anesthesia:   General endotracheal with an interscalene block using Exparel placed preoperatively by the anesthesiologist.  Findings:   As above.  Complications:   None  EBL:   75 cc  Fluids:   600 cc crystalloid  UOP:   None  TT:   None  Drains:   None  Closure:   Staples  Implants:   All press-fit Integra system with a 12 mm stem, a standard metaphyseal body, a +6 mm humeral platform, a mini baseplate, and a 38 mm concentric +2 mm laterally offset glenosphere.  Brief Clinical Note:   The patient is a 81 year old female with a long history of gradually worsening left shoulder pain and weakness. Her symptoms have progressed despite medications, activity modification, etc. Her history and examination are consistent with advanced degenerative joint disease of the left shoulder. Preoperative MRI scanning confirmed the presence of moderate to severe tendinopathy of her rotator cuff. The patient presents at this time for a reverse left total shoulder arthroplasty with biceps tenodesis.  Procedure:   The patient underwent placement of an interscalene block using Exparel by the anesthesiologist in the preoperative holding area before being brought into the operating room and lain in the supine position. The patient then underwent general endotracheal intubation and anesthesia before the patient was repositioned in the beach chair position using the beach chair positioner. The left shoulder and upper extremity were prepped with ChloraPrep solution before being draped sterilely. Preoperative antibiotics were administered.   A timeout was performed to verify the  appropriate surgical site before a standard anterior approach to the shoulder was made through an approximately 4-5 inch incision. The incision was carried down through the subcutaneous tissues to expose the deltopectoral fascia. The interval between the deltoid and pectoralis muscles was identified and this plane developed, retracting the cephalic vein laterally with the deltoid muscle. The conjoined tendon was identified. Its lateral margin was dissected and the Kolbel self-retraining retractor inserted. The "three sisters" were identified and cauterized. Bursal tissues were removed to improve visualization.   The biceps tendon was identified near the inferior aspect of the bicipital groove. A soft tissue tenodesis was performed by attaching the biceps tendon to the adjacent pectoralis major tendon using two #0 Ethibond interrupted sutures. The biceps tendon was then transected just proximal to the tenodesis site. The subscapularis tendon was released from its attachment to the lesser tuberosity 1 cm proximal to its insertion and several tagging sutures placed. The inferior capsule was released with care after identifying and protecting the axillary nerve. The proximal humeral cut was made at approximately 20 of retroversion using the extra-medullary guide.   Attention was redirected to the glenoid. The labrum was debrided circumferentially before the center of the glenoid was identified. The guidewire was drilled into the glenoid neck using the appropriate guide. After verifying its position, it was overreamed with the mini-baseplate reamer to create a flat surface before the stem reamer was utilized. The superior and inferior peg sites were reamed using the appropriate guide to complete the glenoid preparation. The permanent mini-baseplate was impacted into place. It was stabilized with a 20 x 4.5 mm central screw  and four peripheral screws. Locking caps were placed over the superior and inferior screws.  The permanent 38 mm concentric glenosphere with +2 mm of lateral offset was then impacted into place and its Morse taper locking mechanism verified using manual distraction.  Attention was directed to the humeral side. The humeral canal was prepared utilizing the tapered stem reamers sequentially beginning with the 7 mm stem and progressing to a 12 mm stem. This demonstrated a good tight fit. The metaphyseal region was then prepared using the appropriate planar device. The trial stem and standard metaphyseal body were put together on the back table and a trial reduction performed using the +3 mm and +6 mm inserts. With the +6 mm insert, the arm demonstrated excellent range of motion as the hand could be brought across the chest to the opposite shoulder and brought to the top of the patient's head and to the patient's ear. The shoulder appeared stable throughout this range of motion. The joint was dislocated and the trial components removed. The permanent 12 mm stem with the standard body was impacted into place with care taken to maintain the appropriate version. A repeat trial reduction with the +6 mm insert again demonstrated excellent stability with the findings as described above. Therefore, the shoulder was re-dislocated and, after inserting the locking screw to secure the body to the stem, the permanent +6 mm insert impacted into place. After verifying its locking mechanism, the shoulder was relocated using two finger pressure and again placed through a range of motion with the findings as described above.  The wound was copiously irrigated with sterile saline solution using the jet lavage system before a total of 10 cc of Exparel and 20 cc of 0.5% Sensorcaine with epinephrine was injected into the pericapsular and peri-incisional tissues to help with postoperative analgesia. The subscapularis tendon was reapproximated using #2 FiberWire interrupted sutures. The deltopectoral interval was closed using #0  Vicryl interrupted sutures before the subcutaneous tissues were closed using 2-0 Vicryl interrupted sutures. The skin was closed using staples. Prior to closing the skin, 1 g of transexemic acid in 10 cc of normal saline was injected intra-articularly to help with postoperative bleeding. A sterile occlusive dressing was applied to the wound before the arm was placed into a shoulder immobilizer with an abduction pillow. A Polar Care system also was applied to the shoulder. The patient was then transferred back to a hospital bed before being awakened, extubated, and returned to the recovery room in satisfactory condition after tolerating the procedure well.

## 2022-05-16 ENCOUNTER — Encounter: Payer: Self-pay | Admitting: Surgery

## 2022-05-19 LAB — SURGICAL PATHOLOGY

## 2022-07-29 DIAGNOSIS — Z96612 Presence of left artificial shoulder joint: Secondary | ICD-10-CM | POA: Diagnosis not present

## 2022-07-29 DIAGNOSIS — R293 Abnormal posture: Secondary | ICD-10-CM | POA: Diagnosis not present

## 2022-07-29 DIAGNOSIS — Z4789 Encounter for other orthopedic aftercare: Secondary | ICD-10-CM | POA: Diagnosis not present

## 2022-07-29 DIAGNOSIS — M7522 Bicipital tendinitis, left shoulder: Secondary | ICD-10-CM | POA: Diagnosis not present

## 2022-07-29 DIAGNOSIS — M6281 Muscle weakness (generalized): Secondary | ICD-10-CM | POA: Diagnosis not present

## 2022-07-29 DIAGNOSIS — M25512 Pain in left shoulder: Secondary | ICD-10-CM | POA: Diagnosis not present

## 2022-07-29 DIAGNOSIS — M7582 Other shoulder lesions, left shoulder: Secondary | ICD-10-CM | POA: Diagnosis not present

## 2022-07-29 DIAGNOSIS — M19112 Post-traumatic osteoarthritis, left shoulder: Secondary | ICD-10-CM | POA: Diagnosis not present

## 2022-07-31 DIAGNOSIS — M7522 Bicipital tendinitis, left shoulder: Secondary | ICD-10-CM | POA: Diagnosis not present

## 2022-07-31 DIAGNOSIS — M19112 Post-traumatic osteoarthritis, left shoulder: Secondary | ICD-10-CM | POA: Diagnosis not present

## 2022-07-31 DIAGNOSIS — Z96612 Presence of left artificial shoulder joint: Secondary | ICD-10-CM | POA: Diagnosis not present

## 2022-07-31 DIAGNOSIS — M7582 Other shoulder lesions, left shoulder: Secondary | ICD-10-CM | POA: Diagnosis not present

## 2022-07-31 DIAGNOSIS — Z4789 Encounter for other orthopedic aftercare: Secondary | ICD-10-CM | POA: Diagnosis not present

## 2022-07-31 DIAGNOSIS — M6281 Muscle weakness (generalized): Secondary | ICD-10-CM | POA: Diagnosis not present

## 2022-07-31 DIAGNOSIS — M25512 Pain in left shoulder: Secondary | ICD-10-CM | POA: Diagnosis not present

## 2022-07-31 DIAGNOSIS — R293 Abnormal posture: Secondary | ICD-10-CM | POA: Diagnosis not present

## 2022-08-02 ENCOUNTER — Ambulatory Visit (HOSPITAL_COMMUNITY)
Admission: EM | Admit: 2022-08-02 | Discharge: 2022-08-02 | Disposition: A | Discharge status: Home / Self Care | Payer: Medicare HMO | Attending: Physician Assistant | Admitting: Physician Assistant

## 2022-08-02 ENCOUNTER — Encounter (HOSPITAL_COMMUNITY): Payer: Self-pay

## 2022-08-02 DIAGNOSIS — R300 Dysuria: Secondary | ICD-10-CM

## 2022-08-02 DIAGNOSIS — R350 Frequency of micturition: Secondary | ICD-10-CM | POA: Diagnosis not present

## 2022-08-02 LAB — POCT URINALYSIS DIPSTICK, ED / UC
Bilirubin Urine: NEGATIVE
Glucose, UA: NEGATIVE mg/dL
Ketones, ur: NEGATIVE mg/dL
Nitrite: NEGATIVE
Protein, ur: NEGATIVE mg/dL
Specific Gravity, Urine: 1.02 (ref 1.005–1.030)
Urobilinogen, UA: 0.2 mg/dL (ref 0.0–1.0)
pH: 5.5 (ref 5.0–8.0)

## 2022-08-02 MED ORDER — PHENAZOPYRIDINE HCL 200 MG PO TABS: 200.0000 mg | ORAL_TABLET | Freq: Three times a day (TID) | ORAL | 0 refills | Status: AC

## 2022-08-02 MED ORDER — PHENAZOPYRIDINE HCL 200 MG PO TABS: 200.0000 mg | ORAL_TABLET | Freq: Three times a day (TID) | ORAL | 0 refills | Status: DC

## 2022-08-02 MED ORDER — NITROFURANTOIN MONOHYD MACRO 100 MG PO CAPS: 100.0000 mg | ORAL_CAPSULE | Freq: Two times a day (BID) | ORAL | 0 refills | Status: DC

## 2022-08-02 MED ORDER — NITROFURANTOIN MONOHYD MACRO 100 MG PO CAPS: 100.0000 mg | ORAL_CAPSULE | Freq: Two times a day (BID) | ORAL | 0 refills | Status: AC

## 2022-08-02 NOTE — ED Triage Notes (Signed)
Chief Complaint: burning with urination and odor. Patient having some low back pain as well.   Onset: yesterday   Prescriptions or OTC medications tried: Yes- cranberry tablets     with no relief

## 2022-08-02 NOTE — ED Provider Notes (Signed)
[facility]    CSN: [id number] Arrival date & time: 08/02/22  1609      History   Chief Complaint Chief Complaint  Patient presents with   Urinary Tract Infection    HPI Sara Hendrix is a 82 y.o. female.   82 year old female presents with dysuria.  Patient indicates for the past 24 hours she has been having increasing frequency, urgency, dysuria.  Patient indicates she has also noticed an odor to the urine.  She indicates when her symptoms first started she was having some lower back pain and then she started having the frequency and dysuria.  Patient denies any fever, chills, nausea or vomiting.  Patient indicates her last bladder infection/UTI was October 2023.   Urinary Tract Infection   Past Medical History:  Diagnosis Date   Arthritis    GERD (gastroesophageal reflux disease)    Hypertension    Pre-diabetes     Patient Active Problem List   Diagnosis Date Noted   Bimalleolar ankle fracture, right, closed, initial encounter 07/26/2019    Past Surgical History:  Procedure Laterality Date   EYE SURGERY     rt eye cataract   HARDWARE REMOVAL Right 06/25/2020   Procedure: HARDWARE REMOVAL;  Surgeon: Dereck Leep, MD;  Location: [facility] ORS;  Service: Orthopedics;  Laterality: Right;   ORIF ANKLE FRACTURE Right 07/27/2019   Procedure: OPEN REDUCTION INTERNAL FIXATION (ORIF) ANKLE FRACTURE, TRIMALLEOLAR;  Surgeon: Dereck Leep, MD;  Location: [facility] ORS;  Service: Orthopedics;  Laterality: Right;   REVERSE SHOULDER ARTHROPLASTY Left 05/15/2022   Procedure: REVERSE SHOULDER ARTHROPLASTY WITH BICEPS TENODESIS.;  Surgeon: Corky Mull, MD;  Location: [facility] ORS;  Service: Orthopedics;  Laterality: Left;    OB History   No obstetric history on file.      Home Medications    Prior to Admission medications   Medication Sig Start Date End Date Taking? Authorizing Provider  aspirin EC 81 MG tablet Take 81 mg by mouth daily. Swallow whole.   Yes [provider]  atorvastatin (LIPITOR) 20 MG tablet Take 20 mg by mouth at bedtime.    Yes [provider]  cholecalciferol (VITAMIN D3) 25 MCG (1000 UNIT) tablet Take 1,000 Units by mouth daily.   Yes [provider]  lisinopril (ZESTRIL) 20 MG tablet Take 1 tablet (20 mg total) by mouth daily. 01/05/22 08/02/22 Yes Hezzie Bump, NP  magnesium oxide (MAG-OX) 400 MG tablet Take 400 mg by mouth daily.   Yes [provider]  meloxicam (MOBIC) 15 MG tablet Take 15 mg by mouth daily.   Yes [provider]  omeprazole (PRILOSEC) 20 MG capsule Take 20 mg by mouth daily.   Yes [provider]  acetaminophen (TYLENOL) 500 MG tablet Take 1,000 mg by mouth every 6 (six) hours as needed for moderate pain or headache.    [provider]  meclizine (ANTIVERT) 25 MG tablet Take 1 tablet (25 mg total) by mouth 3 (three) times daily as needed for dizziness. 01/05/22   Hezzie Bump, NP  nitrofurantoin, macrocrystal-monohydrate, (MACROBID) 100 MG capsule Take 1 capsule (100 mg total) by mouth 2 (two) times daily. 08/02/22   Nyoka Lint, PA-C  oxyCODONE (ROXICODONE) 5 MG immediate release tablet Take 1-2 tablets (5-10 mg total) by mouth every 4 (four) hours as needed for moderate pain or severe pain. 05/15/22   Poggi, Marshall Cork, MD  phenazopyridine (PYRIDIUM) 200 MG tablet Take 1 tablet (200 mg total) by  mouth 3 (three) times daily. 08/02/22   Nyoka Lint, PA-C    Family History History reviewed. No pertinent family history.  Social History Social History   Tobacco Use   Smoking status: Never   Smokeless tobacco: Never  Substance Use Topics   Alcohol use: Not Currently   Drug use: Never     Allergies   Codeine   Review of Systems Review of Systems  Genitourinary:  Positive for dysuria, frequency and urgency.     Physical Exam Triage Vital Signs ED Triage Vitals  Enc Vitals Group     BP 08/02/22 1658 126/74     Pulse Rate 08/02/22 1658 82      Resp 08/02/22 1658 16     Temp 08/02/22 1658 97.7 F (36.5 C)     Temp Source 08/02/22 1658 Oral     SpO2 08/02/22 1658 96 %     Weight 08/02/22 1658 160 lb (72.6 kg)     Height 08/02/22 1658 '5\' 5"'$  (1.651 m)     Head Circumference --      Peak Flow --      Pain Score 08/02/22 1656 5     Pain Loc --      Pain Edu? --      Excl. in [place]? --    No data found.  Updated Vital Signs BP 126/74 (BP Location: Left Arm)   Pulse 82   Temp 97.7 F (36.5 C) (Oral)   Resp 16   Ht '5\' 5"'$  (1.651 m)   Wt 160 lb (72.6 kg)   SpO2 96%   BMI 26.63 kg/m   Visual Acuity Right Eye Distance:   Left Eye Distance:   Bilateral Distance:    Right Eye Near:   Left Eye Near:    Bilateral Near:     Physical Exam Constitutional:      Appearance: Normal appearance.  Abdominal:     General: Abdomen is flat.     Palpations: Abdomen is soft.     Tenderness: There is abdominal tenderness in the suprapubic area. There is no guarding or rebound.  Neurological:     Mental Status: She is alert.      UC Treatments / Results  Labs (all labs ordered are listed, but only abnormal results are displayed) Labs Reviewed  POCT URINALYSIS DIPSTICK, ED / UC - Abnormal; Notable for the following components:      Result Value   Hgb urine dipstick TRACE (*)    Leukocytes,Ua SMALL (*)    All other components within normal limits    EKG   Radiology No results found.  Procedures Procedures (including critical care time)  Medications Ordered in UC Medications - No data to display  Initial Impression / Assessment and Plan / UC Course  I have reviewed the triage vital signs and the nursing notes.  Pertinent labs & imaging results that were available during my care of the patient were reviewed by me and considered in my medical decision making (see chart for details).    Plan: 1.  The dysuria will be treated with the following: A.  Pyridium 200 mg every 8 hours to help relieve the burning on  urination. 2.  The cystitis will be treated with the following: A.  Macrobid every 12 hours to treat the infection. 3.  Advised follow-up PCP or return to urgent care as needed. Final Clinical Impressions(s) / UC Diagnoses   Final diagnoses:  Dysuria  Urinary frequency  Discharge Instructions      Advised to increase fluid intake in order to flush the urinary system.  Advised take the Macrobid every 12 hours until completed to treat the infection. Advised take the Pyridium 200 mg every 6-8 hours to relieve the burning on urination.  (This will turn your urine a different color)  Advised follow-up PCP or return to urgent care if symptoms fail to improve.     ED Prescriptions     Medication Sig Dispense Auth. Provider   nitrofurantoin, macrocrystal-monohydrate, (MACROBID) 100 MG capsule  (Status: Discontinued) Take 1 capsule (100 mg total) by mouth 2 (two) times daily. 10 capsule Nyoka Lint, PA-C   phenazopyridine (PYRIDIUM) 200 MG tablet  (Status: Discontinued) Take 1 tablet (200 mg total) by mouth 3 (three) times daily. 6 tablet Nyoka Lint, PA-C   nitrofurantoin, macrocrystal-monohydrate, (MACROBID) 100 MG capsule Take 1 capsule (100 mg total) by mouth 2 (two) times daily. 10 capsule Nyoka Lint, PA-C   phenazopyridine (PYRIDIUM) 200 MG tablet Take 1 tablet (200 mg total) by mouth 3 (three) times daily. 6 tablet Nyoka Lint, PA-C      PDMP not reviewed this encounter.   Nyoka Lint, PA-C 08/02/22 1717

## 2022-08-02 NOTE — Discharge Instructions (Addendum)
Advised to increase fluid intake in order to flush the urinary system.  Advised take the Macrobid every 12 hours until completed to treat the infection. Advised take the Pyridium 200 mg every 6-8 hours to relieve the burning on urination.  (This will turn your urine a different color)  Advised follow-up PCP or return to urgent care if symptoms fail to improve.

## 2022-08-04 DIAGNOSIS — R293 Abnormal posture: Secondary | ICD-10-CM | POA: Diagnosis not present

## 2022-08-04 DIAGNOSIS — M6281 Muscle weakness (generalized): Secondary | ICD-10-CM | POA: Diagnosis not present

## 2022-08-04 DIAGNOSIS — Z96612 Presence of left artificial shoulder joint: Secondary | ICD-10-CM | POA: Diagnosis not present

## 2022-08-04 DIAGNOSIS — M7582 Other shoulder lesions, left shoulder: Secondary | ICD-10-CM | POA: Diagnosis not present

## 2022-08-04 DIAGNOSIS — M25512 Pain in left shoulder: Secondary | ICD-10-CM | POA: Diagnosis not present

## 2022-08-04 DIAGNOSIS — M7522 Bicipital tendinitis, left shoulder: Secondary | ICD-10-CM | POA: Diagnosis not present

## 2022-08-04 DIAGNOSIS — Z4789 Encounter for other orthopedic aftercare: Secondary | ICD-10-CM | POA: Diagnosis not present

## 2022-08-04 DIAGNOSIS — M19112 Post-traumatic osteoarthritis, left shoulder: Secondary | ICD-10-CM | POA: Diagnosis not present

## 2022-08-06 DIAGNOSIS — R293 Abnormal posture: Secondary | ICD-10-CM | POA: Diagnosis not present

## 2022-08-06 DIAGNOSIS — M7582 Other shoulder lesions, left shoulder: Secondary | ICD-10-CM | POA: Diagnosis not present

## 2022-08-06 DIAGNOSIS — M25512 Pain in left shoulder: Secondary | ICD-10-CM | POA: Diagnosis not present

## 2022-08-06 DIAGNOSIS — M7522 Bicipital tendinitis, left shoulder: Secondary | ICD-10-CM | POA: Diagnosis not present

## 2022-08-06 DIAGNOSIS — M6281 Muscle weakness (generalized): Secondary | ICD-10-CM | POA: Diagnosis not present

## 2022-08-06 DIAGNOSIS — Z4789 Encounter for other orthopedic aftercare: Secondary | ICD-10-CM | POA: Diagnosis not present

## 2022-08-06 DIAGNOSIS — M19112 Post-traumatic osteoarthritis, left shoulder: Secondary | ICD-10-CM | POA: Diagnosis not present

## 2022-08-06 DIAGNOSIS — Z96612 Presence of left artificial shoulder joint: Secondary | ICD-10-CM | POA: Diagnosis not present

## 2022-08-12 DIAGNOSIS — M6281 Muscle weakness (generalized): Secondary | ICD-10-CM | POA: Diagnosis not present

## 2022-08-12 DIAGNOSIS — M19112 Post-traumatic osteoarthritis, left shoulder: Secondary | ICD-10-CM | POA: Diagnosis not present

## 2022-08-12 DIAGNOSIS — Z96612 Presence of left artificial shoulder joint: Secondary | ICD-10-CM | POA: Diagnosis not present

## 2022-08-12 DIAGNOSIS — M7582 Other shoulder lesions, left shoulder: Secondary | ICD-10-CM | POA: Diagnosis not present

## 2022-08-12 DIAGNOSIS — M25512 Pain in left shoulder: Secondary | ICD-10-CM | POA: Diagnosis not present

## 2022-08-12 DIAGNOSIS — R293 Abnormal posture: Secondary | ICD-10-CM | POA: Diagnosis not present

## 2022-08-12 DIAGNOSIS — Z4789 Encounter for other orthopedic aftercare: Secondary | ICD-10-CM | POA: Diagnosis not present

## 2022-08-12 DIAGNOSIS — M7522 Bicipital tendinitis, left shoulder: Secondary | ICD-10-CM | POA: Diagnosis not present

## 2022-08-14 DIAGNOSIS — M7582 Other shoulder lesions, left shoulder: Secondary | ICD-10-CM | POA: Diagnosis not present

## 2022-08-14 DIAGNOSIS — R293 Abnormal posture: Secondary | ICD-10-CM | POA: Diagnosis not present

## 2022-08-14 DIAGNOSIS — M7522 Bicipital tendinitis, left shoulder: Secondary | ICD-10-CM | POA: Diagnosis not present

## 2022-08-14 DIAGNOSIS — M19112 Post-traumatic osteoarthritis, left shoulder: Secondary | ICD-10-CM | POA: Diagnosis not present

## 2022-08-14 DIAGNOSIS — Z96612 Presence of left artificial shoulder joint: Secondary | ICD-10-CM | POA: Diagnosis not present

## 2022-08-14 DIAGNOSIS — M6281 Muscle weakness (generalized): Secondary | ICD-10-CM | POA: Diagnosis not present

## 2022-08-14 DIAGNOSIS — Z4789 Encounter for other orthopedic aftercare: Secondary | ICD-10-CM | POA: Diagnosis not present

## 2022-08-14 DIAGNOSIS — M25512 Pain in left shoulder: Secondary | ICD-10-CM | POA: Diagnosis not present

## 2022-10-06 DIAGNOSIS — M810 Age-related osteoporosis without current pathological fracture: Secondary | ICD-10-CM | POA: Diagnosis not present

## 2022-10-06 DIAGNOSIS — E785 Hyperlipidemia, unspecified: Secondary | ICD-10-CM | POA: Diagnosis not present

## 2022-10-06 DIAGNOSIS — I10 Essential (primary) hypertension: Secondary | ICD-10-CM | POA: Diagnosis not present

## 2022-10-06 DIAGNOSIS — Z79899 Other long term (current) drug therapy: Secondary | ICD-10-CM | POA: Diagnosis not present

## 2022-10-06 DIAGNOSIS — Z0000 Encounter for general adult medical examination without abnormal findings: Secondary | ICD-10-CM | POA: Diagnosis not present

## 2022-10-07 DIAGNOSIS — R8290 Unspecified abnormal findings in urine: Secondary | ICD-10-CM | POA: Diagnosis not present

## 2022-11-05 DIAGNOSIS — E0590 Thyrotoxicosis, unspecified without thyrotoxic crisis or storm: Secondary | ICD-10-CM | POA: Diagnosis not present

## 2022-11-05 DIAGNOSIS — R1310 Dysphagia, unspecified: Secondary | ICD-10-CM | POA: Diagnosis not present

## 2022-11-17 DIAGNOSIS — M19112 Post-traumatic osteoarthritis, left shoulder: Secondary | ICD-10-CM | POA: Diagnosis not present

## 2022-11-17 DIAGNOSIS — E0590 Thyrotoxicosis, unspecified without thyrotoxic crisis or storm: Secondary | ICD-10-CM | POA: Diagnosis not present

## 2022-11-17 DIAGNOSIS — Z96612 Presence of left artificial shoulder joint: Secondary | ICD-10-CM | POA: Diagnosis not present

## 2022-11-17 DIAGNOSIS — R1310 Dysphagia, unspecified: Secondary | ICD-10-CM | POA: Diagnosis not present

## 2022-11-21 ENCOUNTER — Other Ambulatory Visit: Payer: Self-pay | Admitting: Endocrinology

## 2022-11-21 DIAGNOSIS — E042 Nontoxic multinodular goiter: Secondary | ICD-10-CM

## 2022-11-21 DIAGNOSIS — E0590 Thyrotoxicosis, unspecified without thyrotoxic crisis or storm: Secondary | ICD-10-CM

## 2022-12-01 ENCOUNTER — Encounter
Admission: RE | Admit: 2022-12-01 | Discharge: 2022-12-01 | Disposition: A | Discharge status: Home / Self Care | Payer: Medicare HMO | Source: Ambulatory Visit | Attending: Endocrinology | Admitting: Endocrinology

## 2022-12-01 ENCOUNTER — Other Ambulatory Visit: Payer: Medicare HMO

## 2022-12-01 DIAGNOSIS — E041 Nontoxic single thyroid nodule: Secondary | ICD-10-CM | POA: Diagnosis not present

## 2022-12-01 DIAGNOSIS — E042 Nontoxic multinodular goiter: Secondary | ICD-10-CM | POA: Insufficient documentation

## 2022-12-01 DIAGNOSIS — E0590 Thyrotoxicosis, unspecified without thyrotoxic crisis or storm: Secondary | ICD-10-CM

## 2022-12-01 MED ORDER — SODIUM IODIDE I-123 7.4 MBQ CAPS
450.4100 | ORAL_CAPSULE | Freq: Once | ORAL | Status: AC
  Administered 2022-12-01: 450.41 via ORAL

## 2022-12-09 DIAGNOSIS — M810 Age-related osteoporosis without current pathological fracture: Secondary | ICD-10-CM | POA: Diagnosis not present

## 2023-01-05 DIAGNOSIS — E041 Nontoxic single thyroid nodule: Secondary | ICD-10-CM | POA: Diagnosis not present

## 2023-01-05 DIAGNOSIS — E042 Nontoxic multinodular goiter: Secondary | ICD-10-CM | POA: Diagnosis not present

## 2023-01-14 DIAGNOSIS — E0590 Thyrotoxicosis, unspecified without thyrotoxic crisis or storm: Secondary | ICD-10-CM | POA: Diagnosis not present

## 2023-01-14 DIAGNOSIS — E042 Nontoxic multinodular goiter: Secondary | ICD-10-CM | POA: Diagnosis not present

## 2023-01-14 DIAGNOSIS — R1310 Dysphagia, unspecified: Secondary | ICD-10-CM | POA: Diagnosis not present

## 2024-01-29 IMAGING — MR MR SHOULDER*L* W/O CM
4 of 5 series · 31 of 40 positions shown · non-contrast
Comparison: None Available.

CLINICAL DATA: Left shoulder pain for 6 months with limited range
of motion.

EXAM:
MRI OF THE LEFT SHOULDER WITHOUT CONTRAST
TECHNIQUE: Multiplanar, multisequence MR imaging of the shoulder was performed.
No intravenous contrast was administered.

[Series 5: T2 fat-sat · axial · left · 4.0mm · 0.44mm/px · z∈[-105,+8]mm · 8 of 26 slices shown (1 of 3)]
[im 1/26]
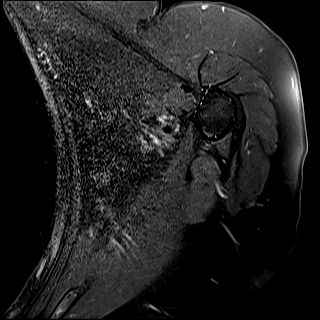
[im 4/26]
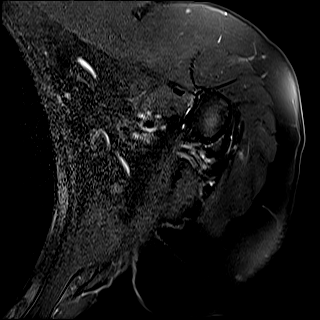
[im 8/26]
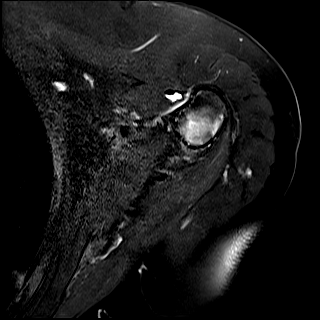
[im 11/26]
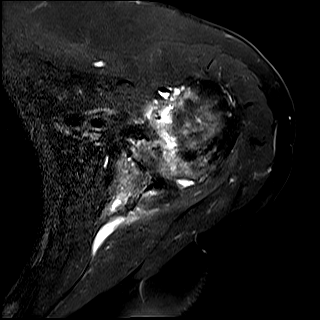
[im 15/26]
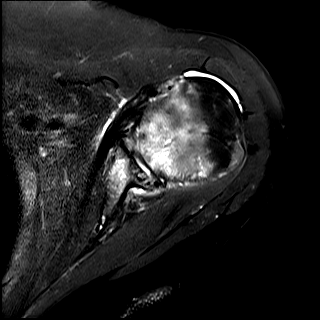
[im 18/26]
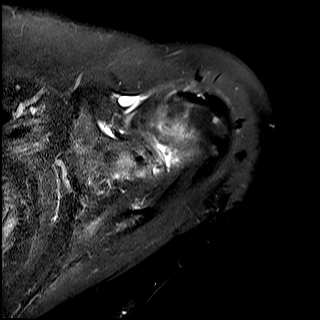
[im 22/26]
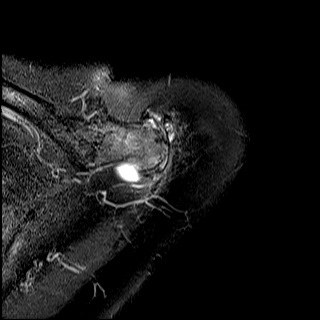
[im 26/26]
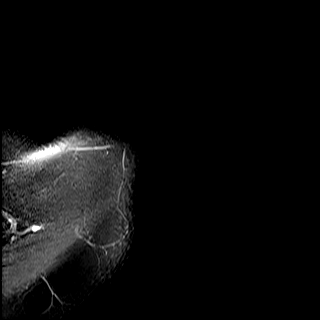

[Series 6: PD · oblique · left · 4.0mm · 0.44mm/px · 9 of 26 slices shown]
[im 1/26]
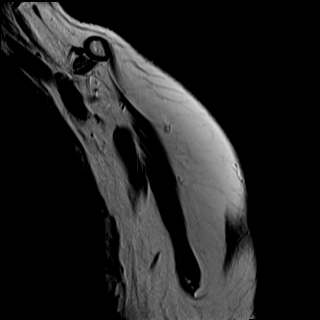
[im 4/26]
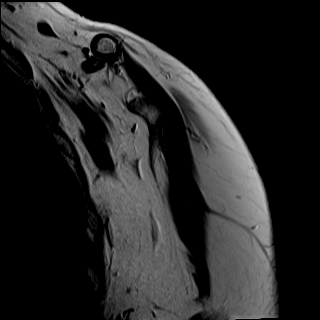
[im 7/26]
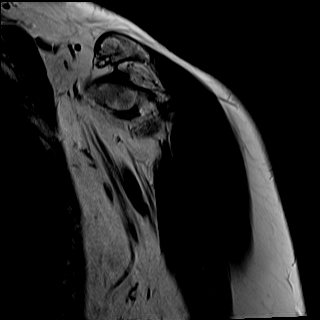
[im 10/26]
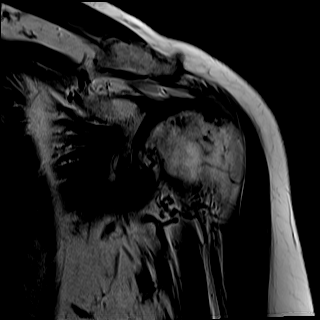
[im 13/26]
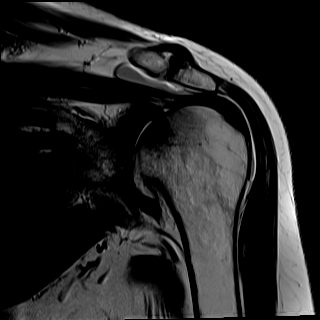
[im 16/26]
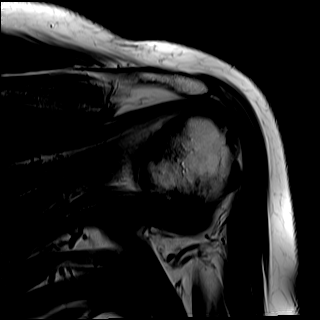
[im 19/26]
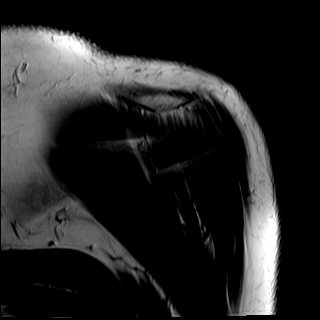
[im 22/26]
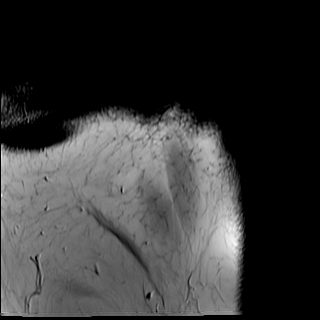
[im 26/26]
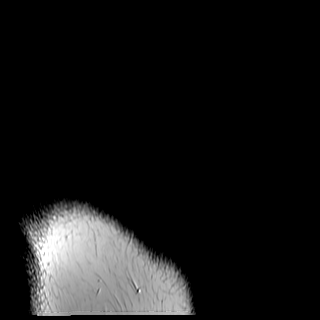

[Series 7: T2 fat-sat · oblique · left · 4.0mm · 0.44mm/px · 9 of 26 slices shown (2 of 3)]
[im 1/26]
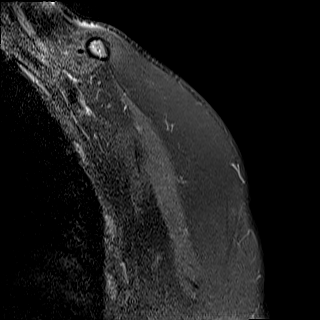
[im 4/26]
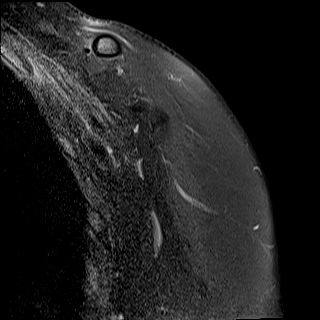
[im 7/26]
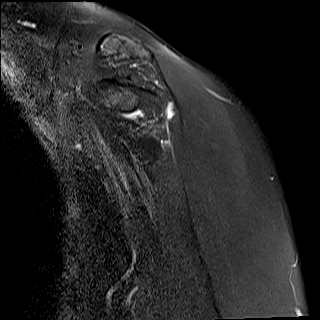
[im 10/26]
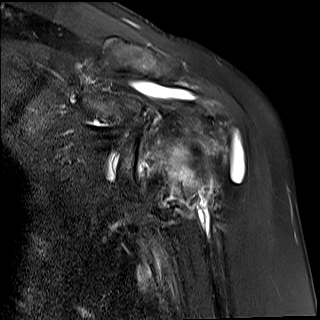
[im 13/26]
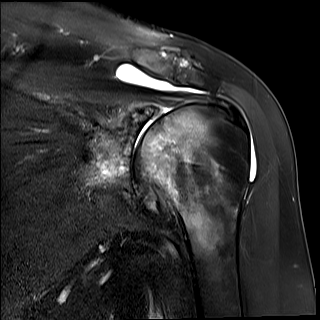
[im 16/26]
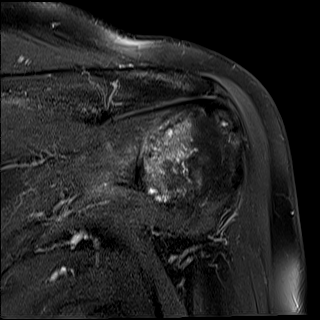
[im 19/26]
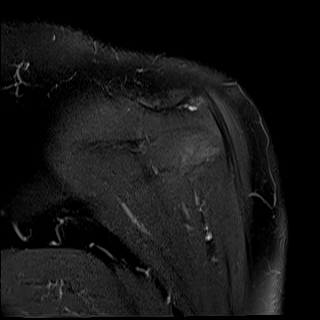
[im 22/26]
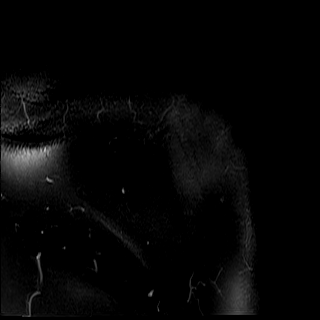
[im 26/26]
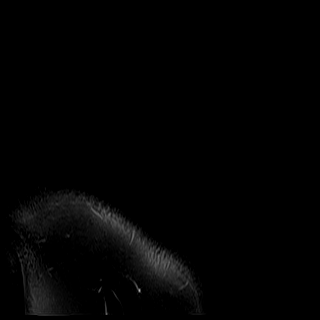

[Series 8: T2 fat-sat · oblique · left · 4.0mm · 0.22mm/px · 5 of 22 slices shown (3 of 3)]
[im 1/22]
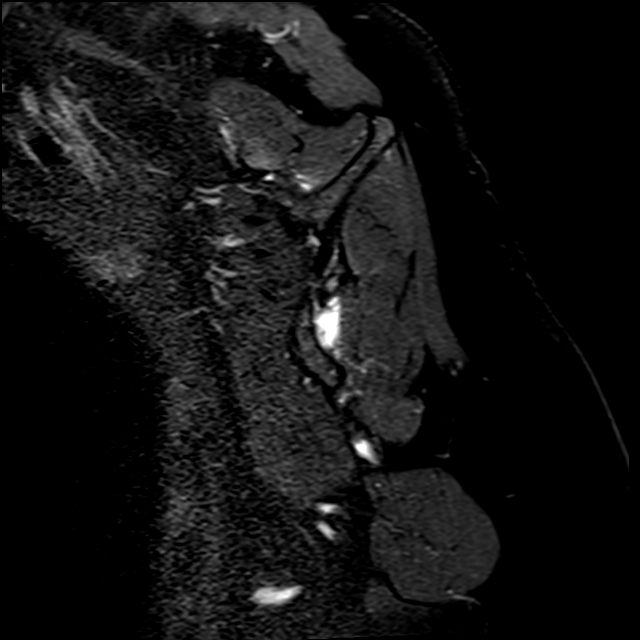
[im 4/22]
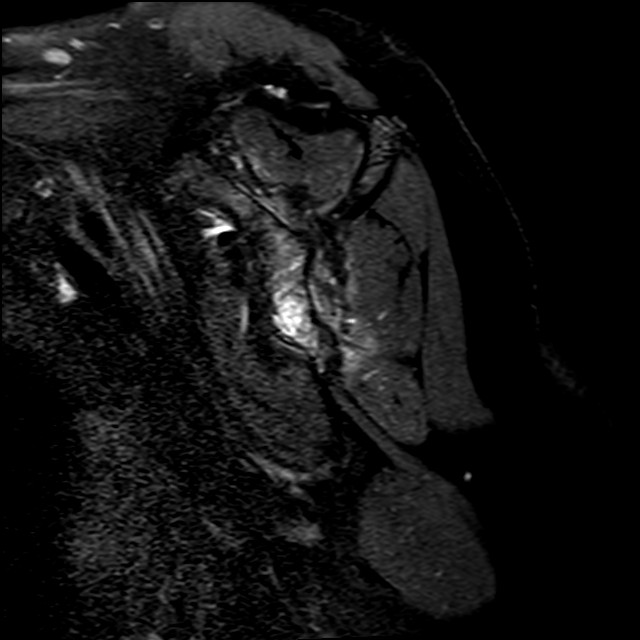
[im 8/22]
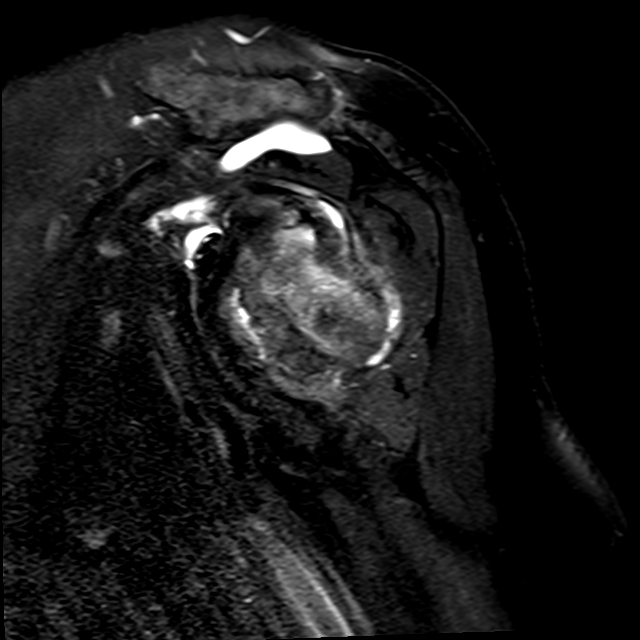
[im 11/22]
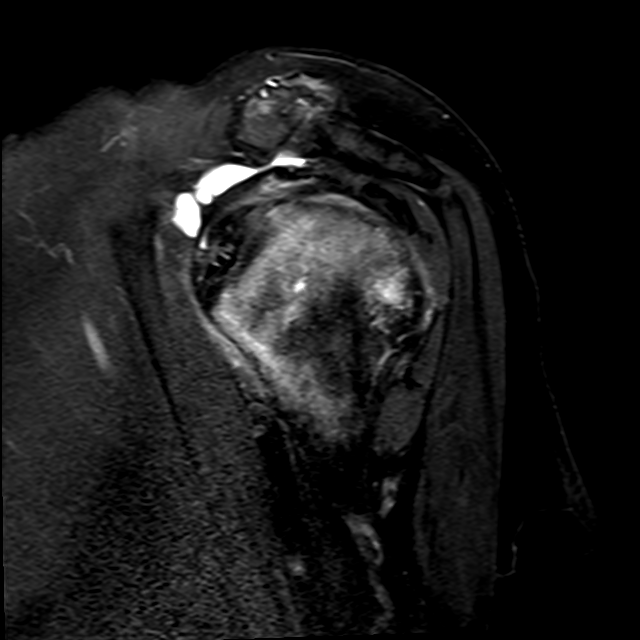
[im 18/22]
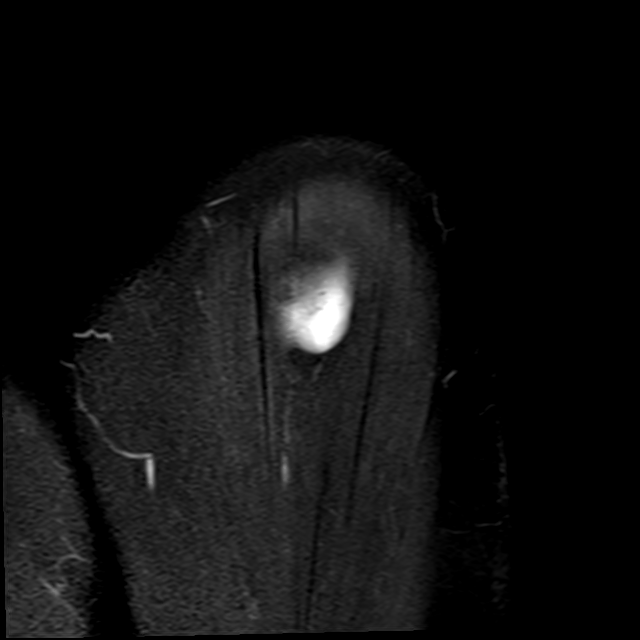

[31 of 40 positions shown; findings below may reference images not displayed]

FINDINGS: Rotator cuff: Substantial thinning in the supraspinatus tendon
compatible with considerable articular surface partial thickness
tearing as shown on image 15 series 7. Moderate subscapularis and
mild supraspinatus tendinopathy.

Muscles: Low-level edema within and along the deep margin of the
inferior portion of the infraspinatus muscle on images 18 through 22
of series 8.

Biceps long head:  Unremarkable

Acromioclavicular Joint: Moderate spurring and mild subcortical
marrow edema along the AC joint compatible with mild degenerative
arthropathy. Type II acromion. Abnormal fluid in the subacromial
subdeltoid bursa compatible with bursitis.

Glenohumeral Joint: Markedly severe degenerative glenohumeral
arthropathy with full-thickness loss of articular cartilage,
prominent spurring of the humeral head, mild volume loss of the
humeral head medially, mild flattening of the acromion, and
extensive degenerative subcortical marrow edema along both the
glenoid and humeral head. No overt glenohumeral joint effusion.
Confluent degenerative subcortical cysts in the superior glenoid.

Labrum: Mildly truncated posterosuperior labrum, suspicious for
degenerative tearing.

Bones:  No additional bony findings.

Other: No supplemental non-categorized findings.
IMPRESSION: 1. Markedly severe degenerative glenohumeral arthropathy. Moderate
degenerative AC joint arthropathy.
2. Articular surface partial tearing of much of the supraspinatus
tendon. Moderate subscapularis and mild supraspinatus tendinopathy.
3. Subacromial subdeltoid bursitis.
4. Potential mild strain of the inferior portion of the
infraspinatus muscle.
5. Truncated posterosuperior labrum, suspicious for degenerative
tearing.
